# Patient Record
Sex: Female | Born: 2000 | Race: Black or African American | Marital: Single
Health system: Midwestern US, Community
[De-identification: ages and names within clinical notes are randomized; demographics above are authoritative.]

## PROBLEM LIST (undated history)

## (undated) DIAGNOSIS — Q682 Congenital deformity of knee: Secondary | ICD-10-CM

## (undated) DIAGNOSIS — Z9889 Other specified postprocedural states: Secondary | ICD-10-CM

---

## 2021-08-17 ENCOUNTER — Ambulatory Visit: Admit: 2021-08-17 | Discharge: 2021-08-17 | Payer: BLUE CROSS/BLUE SHIELD | Attending: Specialist

## 2021-08-17 DIAGNOSIS — Q682 Congenital deformity of knee: Secondary | ICD-10-CM

## 2021-08-17 NOTE — Progress Notes (Signed)
ASSESSMENT/PLAN:  Below is the assessment and plan developed based on review of pertinent history, physical exam, labs, studies, and medications.    1. Congenital patella maltracking  -     MRI KNEE RIGHT WO CONTRAST; Future      Return for Follow-up after diagnostic test.     In discussion with the patient, we considered the numerus possible diagnoses that could be contributing to their present symptoms. We also deliberated on the extensive management options that must be considered to treat their current condition. We reviewed their accessible prior medical records, diagnostic tests, and current health and employment information. We considered how these symptoms were affecting the patients activities of daily living as well as employment and fitness activities. The patient had various questions regarding the possible risks, benefits, complications, morbidity and mortality regarding their diagnosis and treatment options. The patients comorbidities were considered, and I advocated that they consider maximizing lifestyle modification through nutrition and exercise to aid in addressing their symptoms. Shared decision making yielded an understanding to move forward with conservation treatment preferences. The patient expressed understanding that if conservative management fails to alleviate the present symptoms they will return to office for re-evaluation and consideration of additional diagnostic tests and potential surgical options.     In the interim, we have recommended ice, elevation, and take prescription anti-inflammatory medications along with a physician directed home exercise program. We discussed the risks and common side effects of anti-inflammatory medications and instructed the patient to discontinue the medication and contact us if they experienced any side effects. The patient was encouraged to discuss the possible side effects with their family physician or pharmacist prior to initiating any new  medications.    We discussed the fact that many of the recommended treatment options presented are significantly limited by the patients social determinants of health. We also reviewed the circumstances surrounding the environment that they live and work which affect a wide range of health risk. We considered the limited access to appropriate educational resources regarding proper nutrition and exercise as well as the economic and social support necessary to maintain health and wellbeing.     Given that the patient's symptoms are increasing in frequency and duration, we have decided to evaluate the etiology of the pain and loss of function with an MRI. We discussed the risks of an MRI which include, but are not limited to the enclosed space, noisy environment, magnetic effect on implanted metal. We also talked about the fact that MRI is also contraindicated in the presence of internal metallic objects such as bullets or shrapnel, as well as surgical clips, pins, plates, screws, metal sutures, or wire mesh. We talked about the fact that MRI does not use radiation, but it may be contrindicated if the patient has implanted pacemakers, intracranial aneurysm clips, cochlear implants, certain prosthetic devices, implanted drug infusion pumps, neurostimulators, bone-growth stimulators, certain intrauterine contraceptive devices; or any other type of iron-based metal implants. We discussed the fact that if you are pregnant or suspect that you may be pregnant, you should notify your physician and consult with your primary care or obstetrician before having an MRI.  Although rare, we talked about the fact that if contrast dye is used, there is a risk for allergic reaction to the dye. Patients who are allergic to or sensitive to medications, contrast dye, iodine, or shellfish should notify the radiologist or technologist prior to the administration of dye. MRI contrast may also influence other conditions such as allergies,  asthma,  anemia, hypotension (low blood pressure), and sickle cell disease. The patient has expressed understanding of these risks and I will see the patient back after the MRI to discuss the findings as well as the treatment options.    We talked about the fact that she most likely had patellofemoral maltracking on both knees.  Given the fact that she had failed physical therapy as well as bracing and continues to have discomfort despite conservative management we will proceed with a same-day MRI of her right knee.    SUBJECTIVE/OBJECTIVE:  Joy Hall (DOB: June 13, 2000) is a 21 y.o. female, patient,here for evaluation of the Knee Pain (Bilateral R>L)  .   Patient seen today for bilateral knee pain.  She reports she has had a long history with her knees.  She reports she is had difficulty since she was a child.  She reports her knee gives out constantly.  She reports she tried a brace as well as some physical therapy.  She denies any numbness or tingling.  She reports anti-inflammatories make it better.  She reports she has give way episodes fairly commonly.  She reports she has had to stop exercising.    PHYSICAL EXAM:    Upon physical examination, the patient is well developed, well nourished, alert and oriented times three, with normal mood and affect and walks with an antalgic gait.    Upon examination of the right knee, the patient is nontender to palpation along the medial and lateral joint lines, and has no effusion. They are tender to palpation along the medial and lateral facets of the patella. They have no crepitus of the patellofemoral joint with range of motion, but experience discomfort with patella grind testing. The patient has no discomfort with McMurrays maneuvers, and the knee is stable. They have full range of motion. They have 5/5 strength, and are neurovascularly intact distally. There is no erythema, warmth or skin lesions present.    On examination of the contralateral extremity, the patient  is nontender to palpation and has excellent range of motion, stability and strength.    IMAGING:    I have independently reviewed and interpreted the following images:     4 views of the right knee including a PA flexed standing show no evidence of fracture or dislocation.    No Known Allergies    Current Outpatient Medications   Medication Sig Dispense Refill    ibuprofen (ADVIL;MOTRIN) 600 MG tablet TAKE 1 TABLET 3 TIMES A DAY BY ORAL ROUTE AS NEEDED.       No current facility-administered medications for this visit.       No past medical history on file.    No past surgical history on file.    No family history on file.    Social History     Socioeconomic History    Marital status: Single     Spouse name: Not on file    Number of children: Not on file    Years of education: Not on file    Highest education level: Not on file   Occupational History    Not on file   Tobacco Use    Smoking status: Never    Smokeless tobacco: Never   Vaping Use    Vaping Use: Never used   Substance and Sexual Activity    Alcohol use: Never    Drug use: Never    Sexual activity: Not on file   Other Topics Concern    Not on file  Social History Narrative    Not on file     Social Determinants of Health     Financial Resource Strain: Not on file   Food Insecurity: Not on file   Transportation Needs: Not on file   Physical Activity: Not on file   Stress: Not on file   Social Connections: Not on file   Intimate Partner Violence: Not on file   Housing Stability: Not on file       Review of Systems    No flowsheet data found.    Vitals:  Ht 4\' 11"  (1.499 m)   Wt 169 lb (76.7 kg)   BMI 34.13 kg/m    Body mass index is 34.13 kg/m.      An electronic signature was used to authenticate this note.  -- Illene BolusPaul Caldwell, MD

## 2021-08-26 ENCOUNTER — Ambulatory Visit: Admit: 2021-08-26 | Discharge: 2021-08-26 | Payer: BLUE CROSS/BLUE SHIELD

## 2021-08-26 DIAGNOSIS — Q682 Congenital deformity of knee: Secondary | ICD-10-CM

## 2021-09-09 ENCOUNTER — Ambulatory Visit: Admit: 2021-09-09 | Discharge: 2021-09-09 | Payer: BLUE CROSS/BLUE SHIELD | Attending: Specialist

## 2021-09-09 DIAGNOSIS — Q682 Congenital deformity of knee: Secondary | ICD-10-CM

## 2021-09-09 NOTE — Progress Notes (Signed)
ASSESSMENT/PLAN:  Below is the assessment and plan developed based on review of pertinent history, physical exam, labs, studies, and medications.    1. Congenital patella maltracking      No follow-ups on file.     We discussed the treatment options for the patients diagnosis, which included: living with the extremity as it is, organized exercises, medicines, injections, and surgical options. Utilizing shared treatment decision-making; we also discussed the nature and purpose of the treatment options along with the expected risks and benefits. The patient has expressed a desire to proceed with surgery, and I think that is a reasonable option. I educated the patient regarding the inherent and unavoidable risks which include, but are not limited to anesthesia, infection, damage to nerves and blood vessels, blood loss, blood clots, and even death were discussed at length. We also talked about the possibility of not being able to return to prior activities or employment, the need for future surgery, and complex regional pain syndrome. The patient expressed understanding of these risks and has elected to proceed with surgery. Ample time was given for questions, of which many were addressed. We have discussed the surgical procedure as well as the realistic expectations regarding the risks, outcome and post-operative protocol. We will set this up when it is convenient for the patient. We have instructed them to contact us if there are any questions or concerns between now and their date of surgery.     We talked about the procedure as well as the postoperative protocol in detail.  She is can look at her counter and call us to schedule right knee arthroscopy with patellofemoral chondroplasty and possible MACI biopsy with open medial patellofemoral ligament reconstruction and tibial tubercle osteotomy.  She understands she would be in a brace postoperatively and full rehabilitation may take up to 6 months.  We also talked  about the fact that the same process is more than likely occurring in the contralateral side.    SUBJECTIVE/OBJECTIVE:  Joy Hall (DOB: 05-25-2000) is a 21 y.o. female, patient,here for evaluation of the Knee Pain (right)  .   Patient seen today for bilateral knee pain.  She reports she has had a long history with her knees.  She reports she is had difficulty since she was a child.  She reports her knee gives out constantly.  She reports she tried a brace as well as some physical therapy.  She denies any numbness or tingling.  She reports anti-inflammatories make it better.  She reports she has give way episodes fairly commonly.  She reports she has had to stop exercising.    PHYSICAL EXAM:    Upon physical examination, the patient is well developed, well nourished, alert and oriented times three, with normal mood and affect and walks with an antalgic gait.    Upon examination of the right knee, the patient is nontender to palpation along the medial and lateral joint lines, and has no effusion. They are tender to palpation along the medial and lateral facets of the patella. They have no crepitus of the patellofemoral joint with range of motion, but experience discomfort with patella grind testing. The patient has no discomfort with McMurrays maneuvers, and the knee is stable. They have full range of motion. They have 5/5 strength, and are neurovascularly intact distally. There is no erythema, warmth or skin lesions present.    On examination of the contralateral extremity, the patient is nontender to palpation and has excellent range of motion, stability and  strength.    IMAGING:    I have independently reviewed and interpreted the following images:     MRI KNEE RIGHT WO CONTRAST  Narrative: EXAM: MRI KNEE RIGHT WO CONTRAST    INDICATION: Pain    COMPARISON: Radiographs 07/27/2021    TECHNIQUE: Axial T2 fat-saturated and proton density fat-saturated; coronal T1  and proton density fat-saturated; and sagittal T2  fat-saturated, proton density  fat-saturated, and gradient echo MRI of the right knee .    CONTRAST: None.     FINDINGS: Bone marrow: Subcortical osseous contusions at the anterolateral  lateral femoral condyle and inferomedial patella. The pattern of osseous  contusions is consistent with recent transient patellar dislocation.    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 No disruption of the medial patellar retinaculum is  shown. 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 The TT-TG distance is 17 mm. The TT-PCL  distance is 25 mm.    Articular cartilage: No patellar or femoral osteochondral lesion is  demonstrated. There is a small area of intermediate grade chondral derangement  shown at the middle third lateral patellar facet adjacent to the median ridge,  with tiny focus of underlying reactive bony signal.    Joint fluid: Small knee effusion. Trace Baker's cyst.     Collateral ligaments and posterior, lateral corner: Intact.    Medial meniscus: Intact.      Lateral meniscus: Intact.    ACL and PCL: Intact.    Tendons: Intact.    Muscles: Within normal limits.    Soft tissue mass: None.  Impression: Evidence for recent transient patellar dislocation without demonstration of  osteochondral lesion. 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 A small focus of  intermediate grade chondral derangement is shown in the lateral patellar facet.    We will    No Known Allergies    Current Outpatient Medications   Medication Sig Dispense Refill    ibuprofen (ADVIL;MOTRIN) 600 MG tablet TAKE 1 TABLET 3 TIMES A DAY BY ORAL ROUTE AS NEEDED.       No current facility-administered medications for this visit.       No past medical history on file.    No past surgical history on file.    No family history on file.    Social History     Socioeconomic History    Marital status: Single     Spouse name: Not on file     Number of children: Not on file    Years of education: Not on file    Highest education level: Not on file   Occupational History    Not on file   Tobacco Use    Smoking status: Never    Smokeless tobacco: Never   Vaping Use    Vaping Use: Never used   Substance and Sexual Activity    Alcohol use: Never    Drug use: Never    Sexual activity: Not on file   Other Topics Concern    Not on file   Social History Narrative    Not on file     Social Determinants of Health     Financial Resource Strain: Not on file   Food Insecurity: Not on file   Transportation Needs: Not on file   Physical Activity: Not on file   Stress: Not on file   Social Connections: Not on file   Intimate Partner Violence: Not  on file   Housing Stability: Not on file       Review of Systems    No flowsheet data found.    Vitals:  Ht 4\' 11"  (1.499 m)   Wt 170 lb (77.1 kg)   BMI 34.34 kg/m    Body mass index is 34.34 kg/m.      An electronic signature was used to authenticate this note.  -- , MD

## 2021-10-21 ENCOUNTER — Telehealth

## 2021-10-21 NOTE — Telephone Encounter (Signed)
Patient phones office wanting to schedule sx for her right knee. Patient ID x 2. Dates discussed and confirmed patient has surgery folder.

## 2022-02-10 NOTE — Telephone Encounter (Signed)
Patient called and stated they do not have a time for their surgery on Monday. Please reach back out to patient.

## 2022-02-10 NOTE — Telephone Encounter (Signed)
Patient was called back and discussed surgery arrival time.

## 2022-02-13 ENCOUNTER — Encounter

## 2022-02-14 MED ORDER — OXYCODONE-ACETAMINOPHEN 5-325 MG PO TABS
5-325 MG | ORAL_TABLET | ORAL | 0 refills | Status: AC | PRN
Start: 2022-02-14 — End: 2022-02-20

## 2022-02-14 MED ORDER — CEPHALEXIN 500 MG PO CAPS
500 MG | ORAL_CAPSULE | Freq: Four times a day (QID) | ORAL | 0 refills | Status: AC
Start: 2022-02-14 — End: ?

## 2022-02-24 ENCOUNTER — Encounter

## 2022-02-24 ENCOUNTER — Ambulatory Visit: Admit: 2022-02-24 | Discharge: 2022-02-24 | Payer: BLUE CROSS/BLUE SHIELD | Attending: Specialist

## 2022-02-24 ENCOUNTER — Ambulatory Visit: Admit: 2022-02-24 | Payer: MEDICAID

## 2022-02-24 DIAGNOSIS — Q682 Congenital deformity of knee: Secondary | ICD-10-CM

## 2022-02-24 NOTE — Progress Notes (Signed)
ASSESSMENT/PLAN:  Below is the assessment and plan developed based on review of pertinent history, physical exam, labs, studies, and medications.    1. Congenital patella maltracking  -     Amb External Referral To Physical Therapy  2. S/P right knee arthroscopy  -     Amb External Referral To Physical Therapy      Return in about 3 weeks (around 03/17/2022).     After discussing treatment options, we have decided to proceed with formal physical therapy as well as a home exercise program for rehabilitation of the knee. We went over the arthroscopic pictures and removed the stitches during todays visit. We will continue with ice and elevation of the knee to decrease swelling and pain. We will continue to utilize early mobilization and mechanical prophylaxis to reduce the chances of a deep vein thrombosis. We will wean them off any narcotic medications and progress to anti-inflammatories and Tylenol as long as there are no contraindications to these medications.  We also discussed the risk and benefits and common side effects of taking these medications at todays visit. We also had a discussion regarding not driving while on narcotic medications and while impaired from a surgical or medical condition. I will see them back in three weeks to evaluate their progress. They will call us in the interim if they have any questions or concerns prior to their follow up visit.    SUBJECTIVE/OBJECTIVE:  Joy Hall (DOB: 14-Nov-2000) is a 21 y.o. female, patient,here for evaluation of the Knee Pain (Right )    Patient returns today for follow-up of their knee. They underwent Right knee arthroscopy with arthroscopic lateral release, abrasion arthroplasty of patellar chondral defect, MACI biopsy and medial capsular plication with open hamstring harvest, medial patellofemoral ligament reconstruction with autologous hamstring and Fulkerson osteotomy on 02/16/2022.  The patient denies any numbness tingling erythema or warmth. They  have been icing and elevating as well as taking some anti-inflammatory medications.    PHYSICAL EXAM:    Examination of the operative knee reveals that there is a small effusion. The incisions are well healed, without evidence of drainage, erythema, or warmth. Range of motion and strength are progressing appropriately at this stage of rehabilitation. Strength distally is 5/5. There is no calf tenderness and a negative Homans sign. Sensation is intact to light touch distally and there is a brisk capillary refill.    Imaging:    2 views of the right knee show evidence of tibial tubercle osteotomy in good alignment without change in surgical intervention.    No Known Allergies    Current Outpatient Medications   Medication Sig Dispense Refill    ibuprofen (ADVIL;MOTRIN) 600 MG tablet TAKE 1 TABLET 3 TIMES A DAY BY ORAL ROUTE AS NEEDED.      cephALEXin (KEFLEX) 500 MG capsule Take 1 capsule by mouth 4 times daily (Patient not taking: Reported on 02/24/2022) 30 capsule 0     No current facility-administered medications for this visit.        No past medical history on file.    No past surgical history on file.    No family history on file.    Social History     Socioeconomic History    Marital status: Single     Spouse name: Not on file    Number of children: Not on file    Years of education: Not on file    Highest education level: Not on file   Occupational History  Not on file   Tobacco Use    Smoking status: Never    Smokeless tobacco: Never   Vaping Use    Vaping Use: Never used   Substance and Sexual Activity    Alcohol use: Never    Drug use: Never    Sexual activity: Not on file   Other Topics Concern    Not on file   Social History Narrative    Not on file     Social Determinants of Health     Financial Resource Strain: Not on file   Food Insecurity: Not on file   Transportation Needs: Not on file   Physical Activity: Not on file   Stress: Not on file   Social Connections: Not on file   Intimate Partner  Violence: Not on file   Housing Stability: Not on file       Review of Systems    No flowsheet data found.    Vitals:  Ht 1.499 m (4\' 11" )   Wt 77.1 kg (170 lb)   BMI 34.34 kg/m     Body mass index is 34.34 kg/m.     An electronic signature was used to authenticate this note.  -- Cyndia Skeeters, MD

## 2022-03-16 NOTE — Progress Notes (Signed)
Formatting of this note is different from the original.  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 Congenital deformity of knee       2. S/P right knee arthroscopy             Today's Date: 03/16/2022  Therapist: Edison Pace, PT   Visit count: 3  Progress Note Due: 04/06/2022  Plan of Care Due: 05/06/2022  Time in: 1635                                                  Time out: 1718    Pain in: 1-2/10                                                     Pain out: 1/10               Fall risk: Mod                                                             Falls in the past year: zero  Evaluation Date: 03/06/2022                                      Authorization: Pending  Precautions: Universal, COVID,  Partial weight bearing, knee brace locked in extension wen ambulating   Attendance agreement including no show policy reviewed and signed by patient. A copy of the signed agreement is available in the chart.  Patient identifiers confirmed at evaluation.     right knee arthroscopy with arthroscopic lateral release, abrasion arthroplasty of patellar chondral defect, MACI biopsy and medial capsular plication with open hamstring harvest, medial patellofemoral ligament reconstruction with autologous hamstring and Fulkerson osteotomy     Patient identifiers confirmed prior to treatment session.  Does the patient feel safe in their home? Yes    If no, patient response and action taken    Assessment:   Incorporating SAQs requiring mod-max assistance during exercise with concurrent use of [place] Estim. Using sliding board and strap for assisted heel slides with instruction to stay in pain free ROM. Review of leg raise series with mod cuing to  perform correctly. Patient returns to surgeon on 03/21/2022 for one month follow up.     Plan: Follow up about appt with Dr on 03/21/2021  Pt was informed of next appointment on 03/16/2022 at 1645 and reported understanding.     Subjective: Patient states she is doing well - not as much pain as first visit       Objective:   Neuromuscular Reeducation - to improve quadriceps engagement - 23 min  Towel under knee  50% Duty cycle   Cyctle time 5/5   Burst Freq 50 bps   Ramp 2 sec   3 x 3 minutes   (+) time for set up and education and rest breaks     SAQs  2 x 2 min     Therapeutic Exercise - to improve gross LE strength - 20 min   Prone hip extension   SLR w/ assistance - 2-3" off table   Sidelying adduction   Assisted heel slides 2 x 15           Patient Education:  Patient educated on exercise program - handout provided using verbal explanation, demonstration, and handout.    Patient verbalized understanding, demonstrated understanding, and demonstrated technique   Barriers: Insurance coverage    The patient has good potential for the stated goals.    Communication with other providers: N/A    Time Entry  More data exists       03/16/2022   Time Entry   97110 - Therapeutic Exercise Time Entry 20   97112 - Neuromuscular Re-Education Time Entry 23     Charge Capture       Code Description Service Date Service Provider Modifiers Rob Bunting    [id number] [facility] PT-NEUROMUS RE-ED - EA 15 MIN 03/16/2022  5:18 PM Revonda Humphrey, PT GP 2    [id number] [facility] PT THERAPEUTIC EXERCISES 03/16/2022  5:18 PM Revonda Humphrey, PT GP 1         Electronically signed by Revonda Humphrey, PT at 03/16/2022  5:18 PM EST

## 2022-03-21 ENCOUNTER — Encounter

## 2022-03-21 ENCOUNTER — Ambulatory Visit: Admit: 2022-03-21 | Payer: BLUE CROSS/BLUE SHIELD

## 2022-03-21 ENCOUNTER — Ambulatory Visit: Admit: 2022-03-21 | Discharge: 2022-03-21 | Payer: BLUE CROSS/BLUE SHIELD | Attending: Specialist

## 2022-03-21 DIAGNOSIS — Z9889 Other specified postprocedural states: Secondary | ICD-10-CM

## 2022-03-21 NOTE — Progress Notes (Unsigned)
ASSESSMENT/PLAN:  Below is the assessment and plan developed based on review of pertinent history, physical exam, labs, studies, and medications.    1. S/P right knee arthroscopy      No follow-ups on file.     In discussion with the patient, we considered the numerus possible diagnoses that could be contributing to their present symptoms. We also deliberated on the extensive management options that must be considered to treat their current condition. We reviewed their accessible prior medical records, diagnostic tests, and current health and employment information. We considered how these symptoms were affecting the patients activities of daily living as well as employment and fitness activities. The patient had various questions regarding the possible risks, benefits, complications, morbidity and mortality regarding their diagnosis and treatment options. The patients comorbidities were considered, and I advocated that they consider maximizing lifestyle modification through nutrition and exercise to aid in addressing their symptoms. Shared decision making yielded an understanding to move forward with conservation treatment preferences. The patient expressed understanding that if conservative management fails to alleviate the present symptoms they will return to office for re-evaluation and consideration of additional diagnostic tests and potential surgical options.     In the interim, we have recommended ice, elevation, and take prescription anti-inflammatory medications along with a physician directed home exercise program. We discussed the risks and common side effects of anti-inflammatory medications and instructed the patient to discontinue the medication and contact us if they experienced any side effects. The patient was encouraged to discuss the possible side effects with their family physician or pharmacist prior to initiating any new medications.    We discussed the fact that many of the recommended  treatment options presented are significantly limited by the patients social determinants of health. We also reviewed the circumstances surrounding the environment that they live and work which affect a wide range of health risk. We considered the limited access to appropriate educational resources regarding proper nutrition and exercise as well as the economic and social support necessary to maintain health and wellbeing.     Given that the patient's symptoms are increasing in frequency and duration we have decided to prescribe physical therapy.  We talked about the fact that the goal of physical therapy is for the therapist to assist in developing a program to help return the patient to full strength, function and mobility and decrease pain. We also discussed that the therapist may combine several techniques to help decrease pain.  These include but are not limited to stretching, balance exercises, strength training, massage, cold and heat therapy, and electrical stimulation. Although, physical therapy is generally safe, we went over the potential risks to include the worsening of pre-existing conditions, continued pain and no improvement in flexibility, mobility, and strength. We will have the patient follow up after physical therapy to closely monitor their progress. We talked about following up sooner if therapy is not progressing on a weekly basis.         SUBJECTIVE/OBJECTIVE:  Joy Hall (DOB: 02-Jun-2000) is a 22 y.o. female, patient,here for evaluation of the No chief complaint on file.    Patient returns today for follow-up of their knee. They underwent Right knee arthroscopy with arthroscopic lateral release, abrasion arthroplasty of patellar chondral defect, MACI biopsy and medial capsular plication with open hamstring harvest, medial patellofemoral ligament reconstruction with autologous hamstring and Fulkerson osteotomy on 02/16/2022.  The patient denies any numbness tingling erythema or warmth. They  have been icing and elevating as well as  taking some anti-inflammatory medications.    PHYSICAL EXAM:    Examination of the operative knee reveals that there is a small effusion. The incisions are well healed, without evidence of drainage, erythema, or warmth. Range of motion and strength are progressing appropriately at this stage of rehabilitation. Strength distally is 5/5. There is no calf tenderness and a negative Homans sign. Sensation is intact to light touch distally and there is a brisk capillary refill.    Imaging:    2 views of the right knee show evidence of tibial tubercle osteotomy in good alignment without change in surgical intervention.    No Known Allergies    Current Outpatient Medications   Medication Sig Dispense Refill    cephALEXin (KEFLEX) 500 MG capsule Take 1 capsule by mouth 4 times daily (Patient not taking: Reported on 02/24/2022) 30 capsule 0    ibuprofen (ADVIL;MOTRIN) 600 MG tablet TAKE 1 TABLET 3 TIMES A DAY BY ORAL ROUTE AS NEEDED.       No current facility-administered medications for this visit.        No past medical history on file.    No past surgical history on file.    No family history on file.    Social History     Socioeconomic History    Marital status: Single     Spouse name: Not on file    Number of children: Not on file    Years of education: Not on file    Highest education level: Not on file   Occupational History    Not on file   Tobacco Use    Smoking status: Never    Smokeless tobacco: Never   Vaping Use    Vaping Use: Never used   Substance and Sexual Activity    Alcohol use: Never    Drug use: Never    Sexual activity: Not on file   Other Topics Concern    Not on file   Social History Narrative    Not on file     Social Determinants of Health     Financial Resource Strain: Not on file   Food Insecurity: Not on file   Transportation Needs: Not on file   Physical Activity: Not on file   Stress: Not on file   Social Connections: Not on file   Intimate Partner  Violence: Not on file   Housing Stability: Not on file       Review of Systems    No flowsheet data found.    Vitals:  There were no vitals taken for this visit.    There is no height or weight on file to calculate BMI.     An electronic signature was used to authenticate this note.  -- Illene Bolus, MD

## 2022-05-12 ENCOUNTER — Ambulatory Visit: Admit: 2022-05-12 | Discharge: 2022-05-12 | Payer: MEDICAID | Attending: Specialist

## 2022-05-12 DIAGNOSIS — Z9889 Other specified postprocedural states: Secondary | ICD-10-CM

## 2022-05-12 NOTE — Progress Notes (Unsigned)
ASSESSMENT/PLAN:  Below is the assessment and plan developed based on review of pertinent history, physical exam, labs, studies, and medications.    1. S/P right knee arthroscopy      No follow-ups on file.     In discussion with the patient, we considered the numerus possible diagnoses that could be contributing to their present symptoms. We also deliberated on the extensive management options that must be considered to treat their current condition. We reviewed their accessible prior medical records, diagnostic tests, and current health and employment information. We considered how these symptoms were affecting the patients activities of daily living as well as employment and fitness activities. The patient had various questions regarding the possible risks, benefits, complications, morbidity and mortality regarding their diagnosis and treatment options. The patients comorbidities were considered, and I advocated that they consider maximizing lifestyle modification through nutrition and exercise to aid in addressing their symptoms. Shared decision making yielded an understanding to move forward with conservation treatment preferences. The patient expressed understanding that if conservative management fails to alleviate the present symptoms they will return to office for re-evaluation and consideration of additional diagnostic tests and potential surgical options.     In the interim, we have recommended ice, elevation, and take prescription anti-inflammatory medications along with a physician directed home exercise program. We discussed the risks and common side effects of anti-inflammatory medications and instructed the patient to discontinue the medication and contact us if they experienced any side effects. The patient was encouraged to discuss the possible side effects with their family physician or pharmacist prior to initiating any new medications.    We discussed the fact that many of the recommended  treatment options presented are significantly limited by the patients social determinants of health. We also reviewed the circumstances surrounding the environment that they live and work which affect a wide range of health risk. We considered the limited access to appropriate educational resources regarding proper nutrition and exercise as well as the economic and social support necessary to maintain health and wellbeing.     Given that the patient's symptoms are increasing in frequency and duration we have decided to prescribe physical therapy.  We talked about the fact that the goal of physical therapy is for the therapist to assist in developing a program to help return the patient to full strength, function and mobility and decrease pain. We also discussed that the therapist may combine several techniques to help decrease pain.  These include but are not limited to stretching, balance exercises, strength training, massage, cold and heat therapy, and electrical stimulation. Although, physical therapy is generally safe, we went over the potential risks to include the worsening of pre-existing conditions, continued pain and no improvement in flexibility, mobility, and strength. We will have the patient follow up after physical therapy to closely monitor their progress. We talked about following up sooner if therapy is not progressing on a weekly basis.         SUBJECTIVE/OBJECTIVE:  Joy Hall (DOB: 02-Jun-2000) is a 22 y.o. female, patient,here for evaluation of the No chief complaint on file.    Patient returns today for follow-up of their knee. They underwent Right knee arthroscopy with arthroscopic lateral release, abrasion arthroplasty of patellar chondral defect, MACI biopsy and medial capsular plication with open hamstring harvest, medial patellofemoral ligament reconstruction with autologous hamstring and Fulkerson osteotomy on 02/16/2022.  The patient denies any numbness tingling erythema or warmth. They  have been icing and elevating as well as  taking some anti-inflammatory medications.    PHYSICAL EXAM:    Examination of the operative knee reveals that there is a small effusion. The incisions are well healed, without evidence of drainage, erythema, or warmth. Range of motion and strength are progressing appropriately at this stage of rehabilitation. Strength distally is 5/5. There is no calf tenderness and a negative Homans sign. Sensation is intact to light touch distally and there is a brisk capillary refill.    Imaging:    2 views of the right knee show evidence of tibial tubercle osteotomy in good alignment without change in surgical intervention.    No Known Allergies    Current Outpatient Medications   Medication Sig Dispense Refill    cephALEXin (KEFLEX) 500 MG capsule Take 1 capsule by mouth 4 times daily (Patient not taking: Reported on 02/24/2022) 30 capsule 0    ibuprofen (ADVIL;MOTRIN) 600 MG tablet TAKE 1 TABLET 3 TIMES A DAY BY ORAL ROUTE AS NEEDED.       No current facility-administered medications for this visit.        No past medical history on file.    No past surgical history on file.    No family history on file.    Social History     Socioeconomic History    Marital status: Single     Spouse name: Not on file    Number of children: Not on file    Years of education: Not on file    Highest education level: Not on file   Occupational History    Not on file   Tobacco Use    Smoking status: Never    Smokeless tobacco: Never   Vaping Use    Vaping Use: Never used   Substance and Sexual Activity    Alcohol use: Never    Drug use: Never    Sexual activity: Not on file   Other Topics Concern    Not on file   Social History Narrative    Not on file     Social Determinants of Health     Financial Resource Strain: Not on file   Food Insecurity: Not on file   Transportation Needs: Not on file   Physical Activity: Not on file   Stress: Not on file   Social Connections: Not on file   Intimate Partner  Violence: Not on file   Housing Stability: Not on file       Review of Systems    No flowsheet data found.    Vitals:  There were no vitals taken for this visit.    There is no height or weight on file to calculate BMI.     An electronic signature was used to authenticate this note.  -- Illene Bolus, MD

## 2022-06-26 ENCOUNTER — Ambulatory Visit: Admit: 2022-06-26 | Discharge: 2022-06-26 | Payer: MEDICAID | Attending: Specialist

## 2022-06-26 DIAGNOSIS — Z9889 Other specified postprocedural states: Secondary | ICD-10-CM

## 2022-06-26 NOTE — Progress Notes (Signed)
ASSESSMENT/PLAN:  Below is the assessment and plan developed based on review of pertinent history, physical exam, labs, studies, and medications.    1. S/P right knee arthroscopy  -     Amb External Referral To Physical Therapy      No follow-ups on file.     In discussion with the patient, we considered the numerus possible diagnoses that could be contributing to their present symptoms. We also deliberated on the extensive management options that must be considered to treat their current condition. We reviewed their accessible prior medical records, diagnostic tests, and current health and employment information. We considered how these symptoms were affecting the patients activities of daily living as well as employment and fitness activities. The patient had various questions regarding the possible risks, benefits, complications, morbidity and mortality regarding their diagnosis and treatment options. The patients comorbidities were considered, and I advocated that they consider maximizing lifestyle modification through nutrition and exercise to aid in addressing their symptoms. Shared decision making yielded an understanding to move forward with conservation treatment preferences. The patient expressed understanding that if conservative management fails to alleviate the present symptoms they will return to office for re-evaluation and consideration of additional diagnostic tests and potential surgical options.     In the interim, we have recommended ice, elevation, and take prescription anti-inflammatory medications along with a physician directed home exercise program. We discussed the risks and common side effects of anti-inflammatory medications and instructed the patient to discontinue the medication and contact us if they experienced any side effects. The patient was encouraged to discuss the possible side effects with their family physician or pharmacist prior to initiating any new medications.    We  discussed the fact that many of the recommended treatment options presented are significantly limited by the patients social determinants of health. We also reviewed the circumstances surrounding the environment that they live and work which affect a wide range of health risk. We considered the limited access to appropriate educational resources regarding proper nutrition and exercise as well as the economic and social support necessary to maintain health and wellbeing.     Given that the patient's symptoms are increasing in frequency and duration we have decided to prescribe physical therapy.  We talked about the fact that the goal of physical therapy is for the therapist to assist in developing a program to help return the patient to full strength, function and mobility and decrease pain. We also discussed that the therapist may combine several techniques to help decrease pain.  These include but are not limited to stretching, balance exercises, strength training, massage, cold and heat therapy, and electrical stimulation. Although, physical therapy is generally safe, we went over the potential risks to include the worsening of pre-existing conditions, continued pain and no improvement in flexibility, mobility, and strength. We will have the patient follow up after physical therapy to closely monitor their progress. We talked about following up sooner if therapy is not progressing on a weekly basis.     She has been making progress.  Will continue her therapy as well as home exercise program to work on her range of motion especially knee flexion, as well as strengthening.  Will see her back in 4 to 6 weeks for repeat evaluation    SUBJECTIVE/OBJECTIVE:  Joy Hall (DOB: 04-05-2000) is a 22 y.o. female, patient,here for evaluation of the Post-Op Check and Knee Pain (right)    Patient returns today for follow-up of their knee. They underwent  Right knee arthroscopy with arthroscopic lateral release, abrasion  arthroplasty of patellar chondral defect, MACI biopsy and medial capsular plication with open hamstring harvest, medial patellofemoral ligament reconstruction with autologous hamstring and Fulkerson osteotomy on 02/16/2022.  The patient denies any numbness tingling erythema or warmth. They have been icing and elevating as well as taking some anti-inflammatory medications.  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    PHYSICAL EXAM:    Examination of the operative knee reveals that there is a small effusion. The incisions are well healed, without evidence of drainage, erythema, or warmth. Range of motion and strength are progressing appropriately at this stage of rehabilitation. Strength distally is 5/5. There is no calf tenderness and a negative Homans sign. Sensation is intact to light touch distally and there is a brisk capillary refill.    Imaging:    2 views of the right knee show evidence of tibial tubercle osteotomy in good alignment without change in surgical intervention.    No Known Allergies    Current Outpatient Medications   Medication Sig Dispense Refill    ibuprofen (ADVIL;MOTRIN) 600 MG tablet TAKE 1 TABLET 3 TIMES A DAY BY ORAL ROUTE AS NEEDED.      cephALEXin (KEFLEX) 500 MG capsule Take 1 capsule by mouth 4 times daily (Patient not taking: Reported on 02/24/2022) 30 capsule 0     No current facility-administered medications for this visit.        No past medical history on file.    No past surgical history on file.    No family history on file.    Social History     Socioeconomic History    Marital status: Single     Spouse name: Not on file    Number of children: Not on file    Years of education: Not on file    Highest education level: Not on file   Occupational History    Not on file   Tobacco Use    Smoking status: Never    Smokeless tobacco: Never   Vaping Use    Vaping Use: Never used   Substance and Sexual Activity    Alcohol use: Never    Drug use: Never     Sexual activity: Not on file   Other Topics Concern    Not on file   Social History Narrative    Not on file     Social Determinants of Health     Financial Resource Strain: Not on file   Food Insecurity: Not on file   Transportation Needs: Not on file   Physical Activity: Not on file   Stress: Not on file   Social Connections: Not on file   Intimate Partner Violence: Not on file   Housing Stability: Not on file       Review of Systems    Failed to redirect to the Timeline version of the [employer] SmartLink.    Vitals:  Ht 1.499 m (4\' 11" )   Wt 77.1 kg (170 lb)   BMI 34.34 kg/m     Body mass index is 34.34 kg/m.     An electronic signature was used to authenticate this note.  -- Illene Bolus, MD

## 2022-08-07 ENCOUNTER — Ambulatory Visit: Admit: 2022-08-07 | Discharge: 2022-08-07 | Payer: MEDICAID | Attending: Specialist

## 2022-08-07 ENCOUNTER — Encounter: Payer: MEDICAID | Attending: Specialist

## 2022-08-07 DIAGNOSIS — Z9889 Other specified postprocedural states: Secondary | ICD-10-CM

## 2022-08-07 NOTE — Progress Notes (Signed)
ASSESSMENT/PLAN:  Below is the assessment and plan developed based on review of pertinent history, physical exam, labs, studies, and medications.    1. S/P right knee arthroscopy  2. Congenital patella maltracking  3. Simple obesity        No follow-ups on file.     In discussion with the patient, we considered the numerus possible diagnoses that could be contributing to their present symptoms. We also deliberated on the extensive management options that must be considered to treat their current condition. We reviewed their accessible prior medical records, diagnostic tests, and current health and employment information. We considered how these symptoms were affecting the patients activities of daily living as well as employment and fitness activities. The patient had various questions regarding the possible risks, benefits, complications, morbidity and mortality regarding their diagnosis and treatment options. The patients comorbidities were considered, and I advocated that they consider maximizing lifestyle modification through nutrition and exercise to aid in addressing their symptoms. Shared decision making yielded an understanding to move forward with conservation treatment preferences. The patient expressed understanding that if conservative management fails to alleviate the present symptoms they will return to office for re-evaluation and consideration of additional diagnostic tests and potential surgical options.     In the interim, we have recommended ice, elevation, and take prescription anti-inflammatory medications along with a physician directed home exercise program. We discussed the risks and common side effects of anti-inflammatory medications and instructed the patient to discontinue the medication and contact us if they experienced any side effects. The patient was encouraged to discuss the possible side effects with their family physician or pharmacist prior to initiating any new  medications.    We discussed the fact that many of the recommended treatment options presented are significantly limited by the patients social determinants of health. We also reviewed the circumstances surrounding the environment that they live and work which affect a wide range of health risk. We considered the limited access to appropriate educational resources regarding proper nutrition and exercise as well as the economic and social support necessary to maintain health and wellbeing.     Given that the patient's symptoms are increasing in frequency and duration we have decided to prescribe physical therapy.  We talked about the fact that the goal of physical therapy is for the therapist to assist in developing a program to help return the patient to full strength, function and mobility and decrease pain. We also discussed that the therapist may combine several techniques to help decrease pain.  These include but are not limited to stretching, balance exercises, strength training, massage, cold and heat therapy, and electrical stimulation. Although, physical therapy is generally safe, we went over the potential risks to include the worsening of pre-existing conditions, continued pain and no improvement in flexibility, mobility, and strength. We will have the patient follow up after physical therapy to closely monitor their progress. We talked about following up sooner if therapy is not progressing on a weekly basis.     I have encouraged the patient to take an active role in their treatment by pursuing a healthy lifestyle. We had a long discussion regarding the risk of excess weight not only to the musculoskeletal system, but also to every aspect of health. I expressed my concerns regarding the increased risk of several debilitating, and deadly diseases, including diabetes, heart disease, and some cancers. We talked about the fundamental link between obesity and chronic inflammation and pain in the body. I  advised  the patient to contact their primary care provider to discuss weight loss opportunities.    Will continue formal physical therapy and progress her to a home exercise program.  Will release her to start working in the gym.  I will see her back at the end of summer to evaluate her progress.    SUBJECTIVE/OBJECTIVE:  Joy Hall (DOB: 15-Jun-2000) is a 22 y.o. female, patient,here for evaluation of the Knee Pain (right)    Patient returns today for follow-up of their knee. They underwent Right knee arthroscopy with arthroscopic lateral release, abrasion arthroplasty of patellar chondral defect, MACI biopsy and medial capsular plication with open hamstring harvest, medial patellofemoral ligament reconstruction with autologous hamstring and Fulkerson osteotomy on 02/16/2022.  The patient denies any numbness tingling erythema or warmth. They have been icing and elevating as well as taking some anti-inflammatory medications.  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    PHYSICAL EXAM:    Examination of the operative knee reveals that there is a small effusion. The incisions are well healed, without evidence of drainage, erythema, or warmth. Range of motion and strength are progressing appropriately at this stage of rehabilitation. Strength distally is 5/5. There is no calf tenderness and a negative Homans sign. Sensation is intact to light touch distally and there is a brisk capillary refill.    Imaging:    2 views of the right knee show evidence of tibial tubercle osteotomy in good alignment without change in surgical intervention.    No Known Allergies    Current Outpatient Medications   Medication Sig Dispense Refill    ibuprofen (ADVIL;MOTRIN) 600 MG tablet TAKE 1 TABLET 3 TIMES A DAY BY ORAL ROUTE AS NEEDED.      cephALEXin (KEFLEX) 500 MG capsule Take 1 capsule by mouth 4 times daily (Patient not taking: Reported on 02/24/2022) 30 capsule 0     No current facility-administered  medications for this visit.        No past medical history on file.    No past surgical history on file.    No family history on file.    Social History     Socioeconomic History    Marital status: Single     Spouse name: Not on file    Number of children: Not on file    Years of education: Not on file    Highest education level: Not on file   Occupational History    Not on file   Tobacco Use    Smoking status: Never    Smokeless tobacco: Never   Vaping Use    Vaping Use: Never used   Substance and Sexual Activity    Alcohol use: Never    Drug use: Never    Sexual activity: Not on file   Other Topics Concern    Not on file   Social History Narrative    Not on file     Social Determinants of Health     Financial Resource Strain: Not on file   Food Insecurity: Not on file   Transportation Needs: Not on file   Physical Activity: Not on file   Stress: Not on file   Social Connections: Not on file   Intimate Partner Violence: Not on file   Housing Stability: Not on file       Review of Systems    Failed to redirect to the Timeline version of the [employer] SmartLink.  Vitals:  Ht 1.499 m (4\' 11" )   Wt 77.1 kg (170 lb)   BMI 34.34 kg/m     Body mass index is 34.34 kg/m.     An electronic signature was used to authenticate this note.  -- Illene Bolus, MD

## 2022-08-07 NOTE — Progress Notes (Unsigned)
ASSESSMENT/PLAN:  Below is the assessment and plan developed based on review of pertinent history, physical exam, labs, studies, and medications.    1. S/P right knee arthroscopy  2. Congenital patella maltracking      No follow-ups on file.     In discussion with the patient, we considered the numerus possible diagnoses that could be contributing to their present symptoms. We also deliberated on the extensive management options that must be considered to treat their current condition. We reviewed their accessible prior medical records, diagnostic tests, and current health and employment information. We considered how these symptoms were affecting the patients activities of daily living as well as employment and fitness activities. The patient had various questions regarding the possible risks, benefits, complications, morbidity and mortality regarding their diagnosis and treatment options. The patients comorbidities were considered, and I advocated that they consider maximizing lifestyle modification through nutrition and exercise to aid in addressing their symptoms. Shared decision making yielded an understanding to move forward with conservation treatment preferences. The patient expressed understanding that if conservative management fails to alleviate the present symptoms they will return to office for re-evaluation and consideration of additional diagnostic tests and potential surgical options.     In the interim, we have recommended ice, elevation, and take prescription anti-inflammatory medications along with a physician directed home exercise program. We discussed the risks and common side effects of anti-inflammatory medications and instructed the patient to discontinue the medication and contact us if they experienced any side effects. The patient was encouraged to discuss the possible side effects with their family physician or pharmacist prior to initiating any new medications.    We discussed the  fact that many of the recommended treatment options presented are significantly limited by the patients social determinants of health. We also reviewed the circumstances surrounding the environment that they live and work which affect a wide range of health risk. We considered the limited access to appropriate educational resources regarding proper nutrition and exercise as well as the economic and social support necessary to maintain health and wellbeing.     Given that the patient's symptoms are increasing in frequency and duration we have decided to prescribe physical therapy.  We talked about the fact that the goal of physical therapy is for the therapist to assist in developing a program to help return the patient to full strength, function and mobility and decrease pain. We also discussed that the therapist may combine several techniques to help decrease pain.  These include but are not limited to stretching, balance exercises, strength training, massage, cold and heat therapy, and electrical stimulation. Although, physical therapy is generally safe, we went over the potential risks to include the worsening of pre-existing conditions, continued pain and no improvement in flexibility, mobility, and strength. We will have the patient follow up after physical therapy to closely monitor their progress. We talked about following up sooner if therapy is not progressing on a weekly basis.     She has been making progress.  Will continue her therapy as well as home exercise program to work on her range of motion especially knee flexion, as well as strengthening.  Will see her back in 4 to 6 weeks for repeat evaluation    SUBJECTIVE/OBJECTIVE:  Joy Hall (DOB: 07-01-2000) is a 22 y.o. female, patient,here for evaluation of the No chief complaint on file.    Patient returns today for follow-up of their knee. They underwent Right knee arthroscopy with arthroscopic lateral release, abrasion  arthroplasty of patellar  chondral defect, MACI biopsy and medial capsular plication with open hamstring harvest, medial patellofemoral ligament reconstruction with autologous hamstring and Fulkerson osteotomy on 02/16/2022.  The patient denies any numbness tingling erythema or warmth. They have been icing and elevating as well as taking some anti-inflammatory medications.  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    PHYSICAL EXAM:    Examination of the operative knee reveals that there is a small effusion. The incisions are well healed, without evidence of drainage, erythema, or warmth. Range of motion and strength are progressing appropriately at this stage of rehabilitation. Strength distally is 5/5. There is no calf tenderness and a negative Homans sign. Sensation is intact to light touch distally and there is a brisk capillary refill.    Imaging:    2 views of the right knee show evidence of tibial tubercle osteotomy in good alignment without change in surgical intervention.    No Known Allergies    Current Outpatient Medications   Medication Sig Dispense Refill   . cephALEXin (KEFLEX) 500 MG capsule Take 1 capsule by mouth 4 times daily (Patient not taking: Reported on 02/24/2022) 30 capsule 0   . ibuprofen (ADVIL;MOTRIN) 600 MG tablet TAKE 1 TABLET 3 TIMES A DAY BY ORAL ROUTE AS NEEDED.       No current facility-administered medications for this visit.        No past medical history on file.    No past surgical history on file.    No family history on file.    Social History     Socioeconomic History   . Marital status: Single     Spouse name: Not on file   . Number of children: Not on file   . Years of education: Not on file   . Highest education level: Not on file   Occupational History   . Not on file   Tobacco Use   . Smoking status: Never   . Smokeless tobacco: Never   Vaping Use   . Vaping Use: Never used   Substance and Sexual Activity   . Alcohol use: Never   . Drug use: Never   . Sexual activity:  Not on file   Other Topics Concern   . Not on file   Social History Narrative   . Not on file     Social Determinants of Health     Financial Resource Strain: Not on file   Food Insecurity: Not on file   Transportation Needs: Not on file   Physical Activity: Not on file   Stress: Not on file   Social Connections: Not on file   Intimate Partner Violence: Not on file   Housing Stability: Not on file       Review of Systems    Failed to redirect to the Timeline version of the REVFS SmartLink.    Vitals:  There were no vitals taken for this visit.    There is no height or weight on file to calculate BMI.     An electronic signature was used to authenticate this note.  -- Illene Bolus, MD

## 2022-10-23 ENCOUNTER — Ambulatory Visit: Admit: 2022-10-23 | Discharge: 2022-10-23 | Payer: MEDICAID | Attending: Specialist

## 2022-10-23 NOTE — Progress Notes (Signed)
ASSESSMENT/PLAN:  Below is the assessment and plan developed based on review of pertinent history, physical exam, labs, studies, and medications.    1. S/P right knee arthroscopy  2. Congenital patella maltracking  3. Quadriceps weakness  4. Simple obesity        Return if symptoms worsen or fail to improve.     In discussion with the patient, we considered the numerus possible diagnoses that could be contributing to their present symptoms. We also deliberated on the extensive management options that must be considered to treat their current condition. We reviewed their accessible prior medical records, diagnostic tests, and current health and employment information. We considered how these symptoms were affecting the patients activities of daily living as well as employment and fitness activities. The patient had various questions regarding the possible risks, benefits, complications, morbidity and mortality regarding their diagnosis and treatment options. The patients comorbidities were considered, and I advocated that they consider maximizing lifestyle modification through nutrition and exercise to aid in addressing their symptoms. Shared decision making yielded an understanding to move forward with conservation treatment preferences. The patient expressed understanding that if conservative management fails to alleviate the present symptoms they will return to office for re-evaluation and consideration of additional diagnostic tests and potential surgical options.     In the interim, we have recommended ice, elevation, and take prescription anti-inflammatory medications along with a physician directed home exercise program. We discussed the risks and common side effects of anti-inflammatory medications and instructed the patient to discontinue the medication and contact us if they experienced any side effects. The patient was encouraged to discuss the possible side effects with their family physician or  pharmacist prior to initiating any new medications.    We discussed the fact that many of the recommended treatment options presented are significantly limited by the patients social determinants of health. We also reviewed the circumstances surrounding the environment that they live and work which affect a wide range of health risk. We considered the limited access to appropriate educational resources regarding proper nutrition and exercise as well as the economic and social support necessary to maintain health and wellbeing.     Given that the patient's symptoms are increasing in frequency and duration we have decided to prescribe physical therapy.  We talked about the fact that the goal of physical therapy is for the therapist to assist in developing a program to help return the patient to full strength, function and mobility and decrease pain. We also discussed that the therapist may combine several techniques to help decrease pain.  These include but are not limited to stretching, balance exercises, strength training, massage, cold and heat therapy, and electrical stimulation. Although, physical therapy is generally safe, we went over the potential risks to include the worsening of pre-existing conditions, continued pain and no improvement in flexibility, mobility, and strength. We will have the patient follow up after physical therapy to closely monitor their progress. We talked about following up sooner if therapy is not progressing on a weekly basis.     I have encouraged the patient to take an active role in their treatment by pursuing a healthy lifestyle. We had a long discussion regarding the risk of excess weight not only to the musculoskeletal system, but also to every aspect of health. I expressed my concerns regarding the increased risk of several debilitating, and deadly diseases, including diabetes, heart disease, and some cancers. We talked about the fundamental link between obesity and chronic  inflammation and pain in the body. I advised the patient to contact their primary care provider to discuss weight loss opportunities.    Continues to progress well with therapy and home exercise program.  Currently denies any pain or discomfort in the right knee.  She feels that she can do all of her ADLs and physical activities without any issue or hindrance.  Overall, she is very happy with her results.  Will see her on an as-needed basis.    SUBJECTIVE/OBJECTIVE:  Joy Hall (DOB: October 11, 2000) is a 22 y.o. female, patient,here for evaluation of the right knee.    Patient returns today for follow-up of their knee. They underwent Right knee arthroscopy with arthroscopic lateral release, abrasion arthroplasty of patellar chondral defect, MACI biopsy and medial capsular plication with open hamstring harvest, medial patellofemoral ligament reconstruction with autologous hamstring and Fulkerson osteotomy on 02/16/2022.  The patient denies any numbness tingling erythema or warmth.  She is doing very well and has no pain, discomfort in the knee.  She feels that she has progressed very well with therapy.  She continues to do therapy twice a week as well as a home exercise program.    PHYSICAL EXAM:    Examination of the operative knee reveals that there is no obvious effusion. The incisions are well healed, without evidence of drainage, erythema, or warmth.  She currently has painless full passive and active range of motion of the right knee. Strength is 5/5. There is no calf tenderness and a negative Homans sign. Sensation is intact to light touch distally and there is a brisk capillary refill.    Imaging:    2 views of the right knee show evidence of tibial tubercle osteotomy in good alignment without change in surgical intervention.    No Known Allergies    Current Outpatient Medications   Medication Sig Dispense Refill    ibuprofen (ADVIL;MOTRIN) 600 MG tablet TAKE 1 TABLET 3 TIMES A DAY BY ORAL ROUTE AS NEEDED.       cephALEXin (KEFLEX) 500 MG capsule Take 1 capsule by mouth 4 times daily (Patient not taking: Reported on 02/24/2022) 30 capsule 0     No current facility-administered medications for this visit.        No past medical history on file.    No past surgical history on file.    No family history on file.    Social History     Socioeconomic History    Marital status: Single     Spouse name: Not on file    Number of children: Not on file    Years of education: Not on file    Highest education level: Not on file   Occupational History    Not on file   Tobacco Use    Smoking status: Never    Smokeless tobacco: Never   Vaping Use    Vaping Use: Never used   Substance and Sexual Activity    Alcohol use: Never    Drug use: Never    Sexual activity: Not on file   Other Topics Concern    Not on file   Social History Narrative    Not on file     Social Determinants of Health     Financial Resource Strain: Not on file   Food Insecurity: Not on file   Transportation Needs: Not on file   Physical Activity: Not on file   Stress: Not on file   Social Connections: Not on file  Intimate Partner Violence: Not on file   Housing Stability: Not on file       Review of Systems    Failed to redirect to the Timeline version of the REVFS SmartLink.    Vitals:  Ht 1.499 m (4\' 11" )   Wt 77.1 kg (170 lb)   BMI 34.34 kg/m     Body mass index is 34.34 kg/m.     An electronic signature was used to authenticate this note.  -- Illene Bolus, MD

## 2022-12-28 IMAGING — CT CT KNEE RT W/O CONTRAST
2 of 3 series · 7 of 33 positions shown, 8 images · non-contrast
Comparison: Outside MRI report dated 08/27/2021.

﻿EXAM:  CT KNEE RT W/O CONTRAST
INDICATION: Chronic instability of the right knee.  Right knee surgery in January 2022.
TECHNIQUE: CT was performed through the right knee. Multi planar and 3D reconstruction images obtained. Radiation dose 419 mGy cm.  Images were reviewed in multiple windows and projections. Exam was performed using 1 or more of the following dose reduction techniques: Automated exposure control, adjustment of the mA and/or kV according to patient size, or the use of iterative reconstruction technique.

[axial · axial · 0.26mm/px · z∈[+540,+676]mm · 4 of 100 slices shown, 5 images]
[im 16/100  soft-tissue]
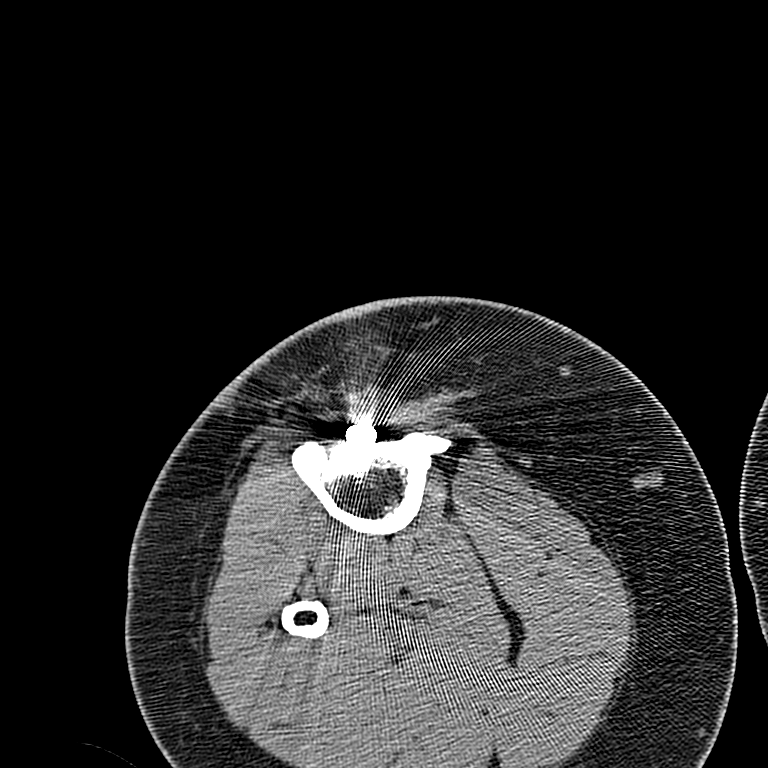
[im 16/100  bone]
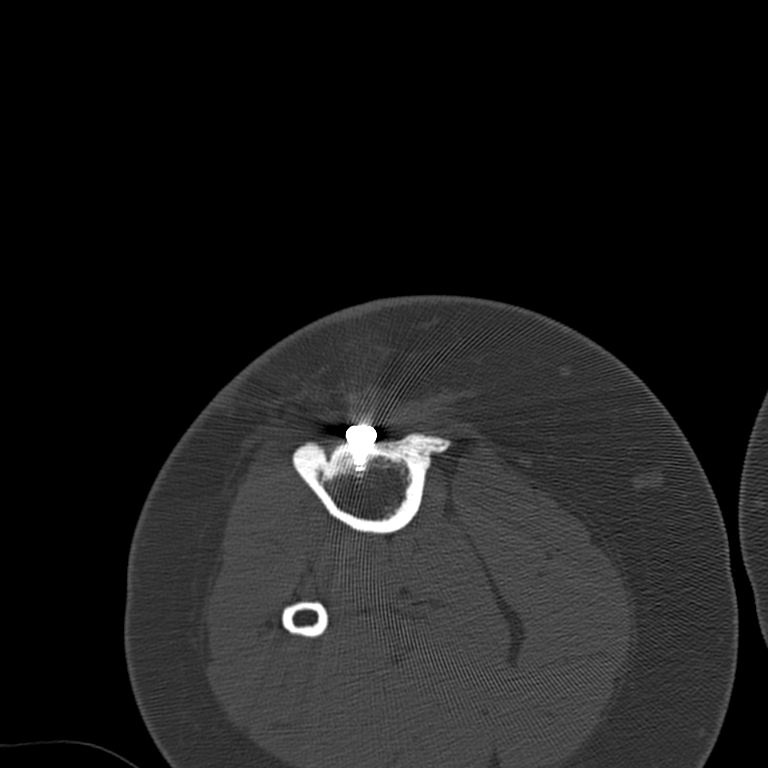
[im 39/100  bone]
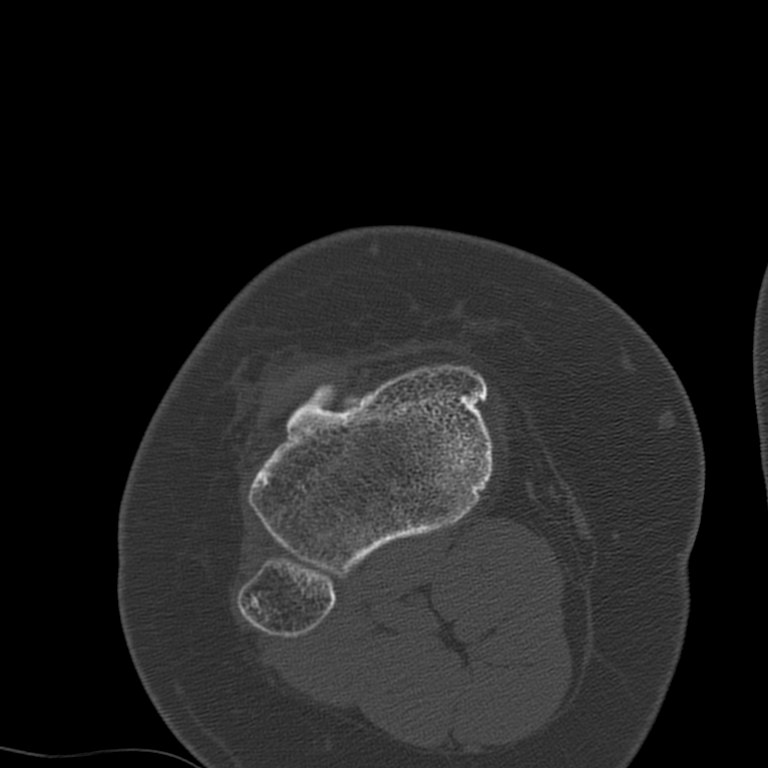
[im 61/100  bone]
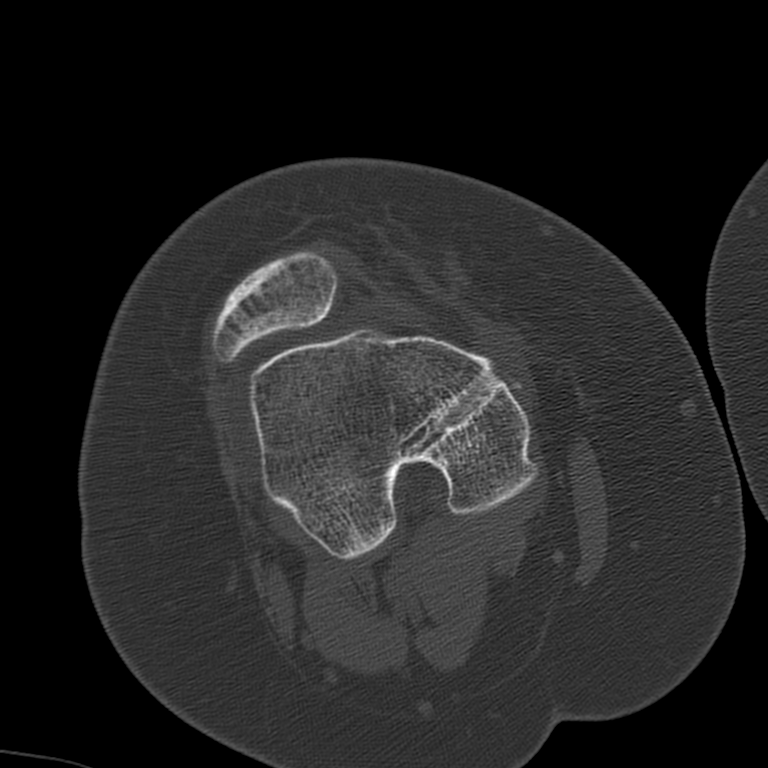
[im 84/100  bone]
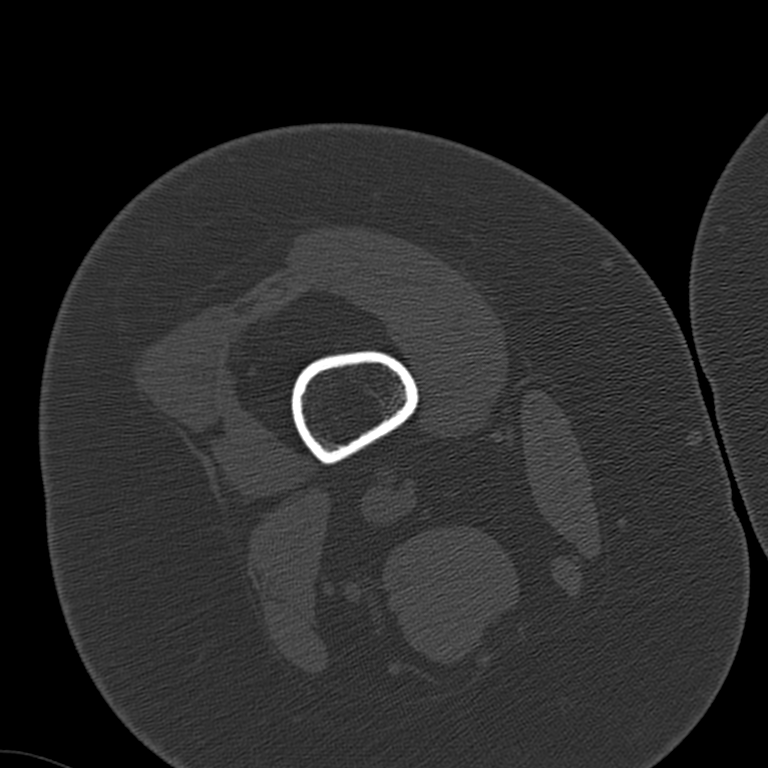

[cor · coronal · 0.26mm/px · 3 of 68 slices shown]
[im 14/68  bone]
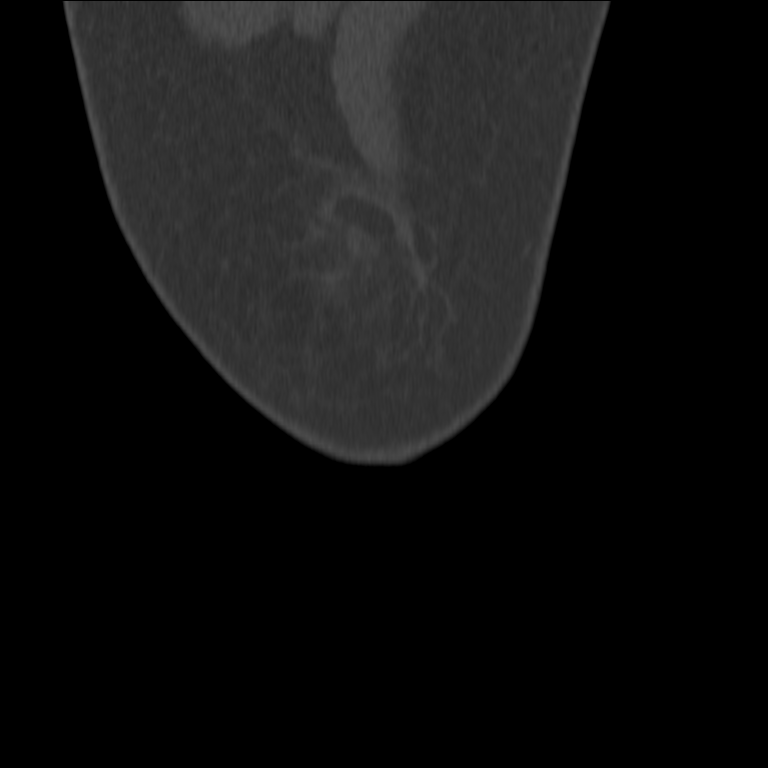
[im 27/68  bone]
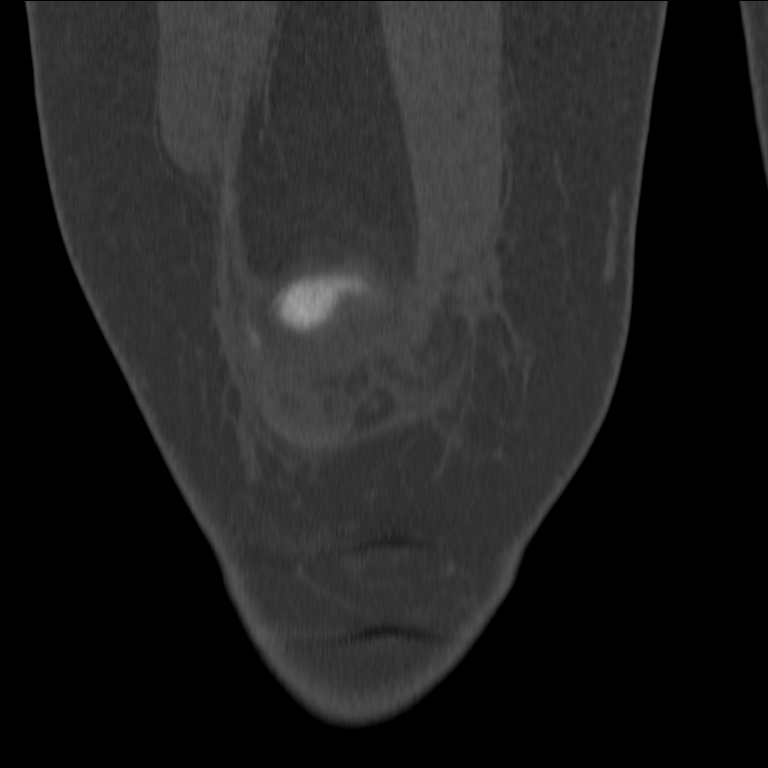
[im 41/68  bone]
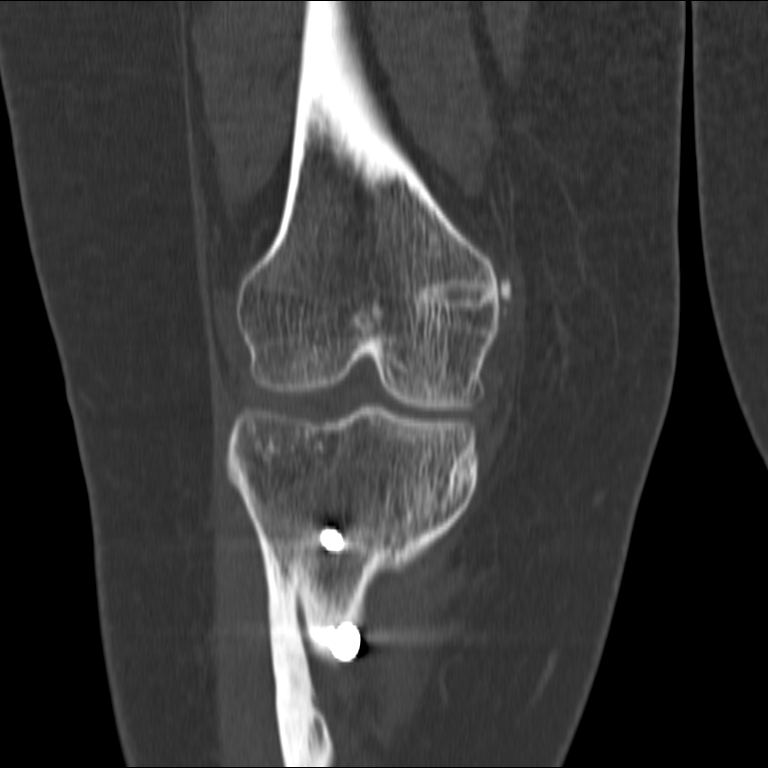

[7 of 33 positions shown; findings below may reference images not displayed]

FINDINGS: No acute bony lesions are seen.  Post-surgical changes of proximal tibia is noted. Prominent anterior osteophyte formation from the anterior superior articular surface of the proximal tibia is noted.

Mild loss of joint space of the lateral facet of patellofemoral joint.  No significant dislocation or subluxation is seen.  Small effusion is noted in the knee joint.
IMPRESSION: 1. Post-surgical changes of proximal tibia.

2. Anterior osteophyte formation from the anterior superior surface of proximal tibia at the knee.

3. Mild loss of joint space of the lateral facet of patellofemoral joint.  No significant dislocation or subluxation.  Mild patella Alta.

Electronically Signed by QUEJADA, CONCEJO at [id number] [DATE]

## 2023-01-09 IMAGING — MR MRI KNEE RT W/O CONTRAST
5 series · 35 of 40 positions shown · IV contrast (gadolinium)
Comparison: CT of the right knee dated 12/27/2021.

﻿EXAM:  [id number]   MRI KNEE RT W/O CONTRAST
INDICATION: 22-year-old with persistent right knee pain.  Previous history of knee surgery in 5855.  Clinical diagnosis common peroneal nerve neuropathy.
TECHNIQUE: Multiplanar, multisequential MRI of the right knee was performed without gadolinium contrast.

[Series 5: PD fat-sat · axial · right · 5.5mm · 0.39mm/px · z∈[-16,+134]mm · 8 of 26 slices shown (1 of 3)]
[im 1/26]
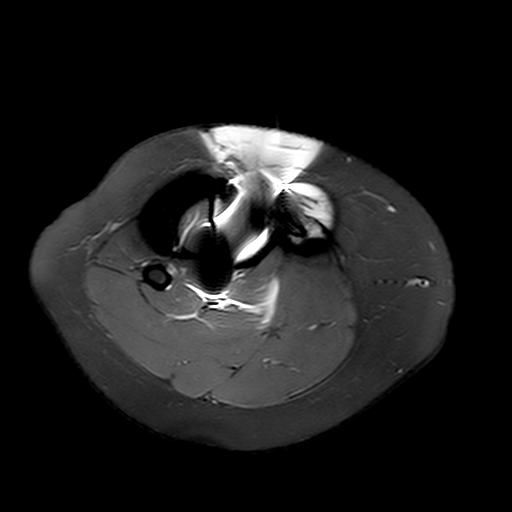
[im 4/26]
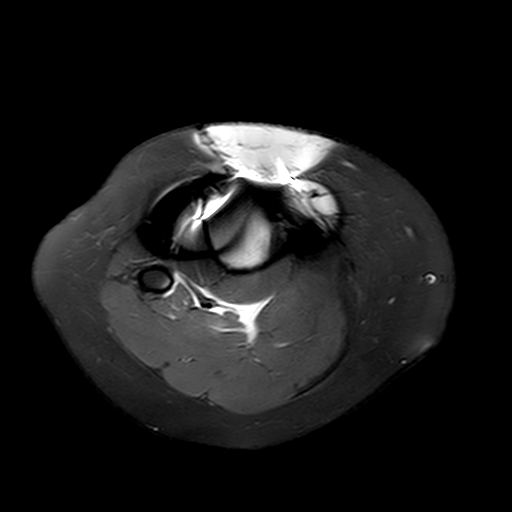
[im 8/26]
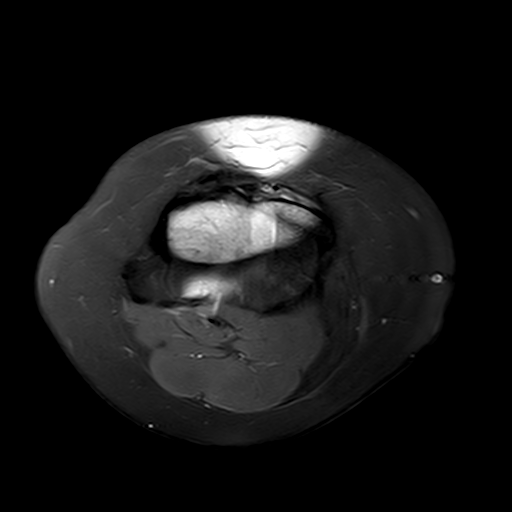
[im 11/26]
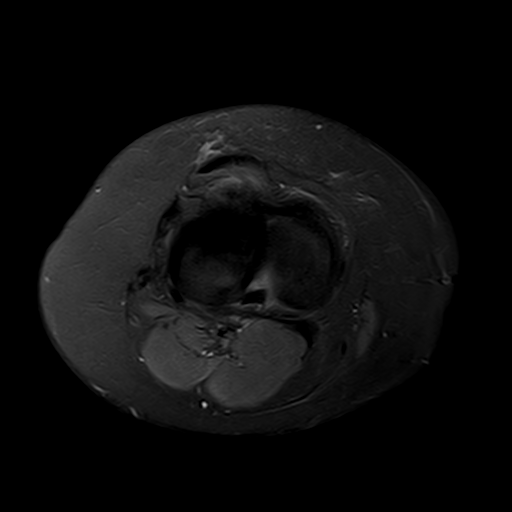
[im 15/26]
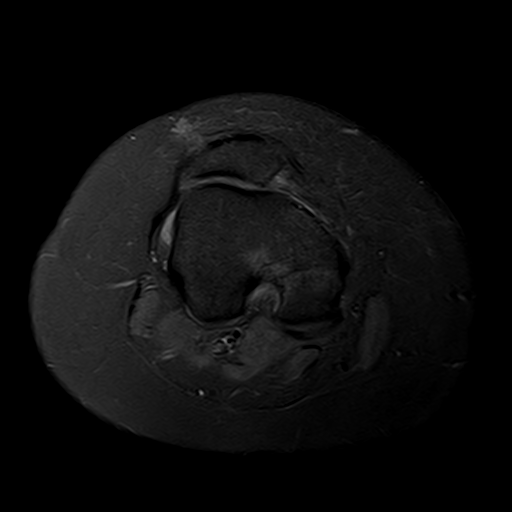
[im 18/26]
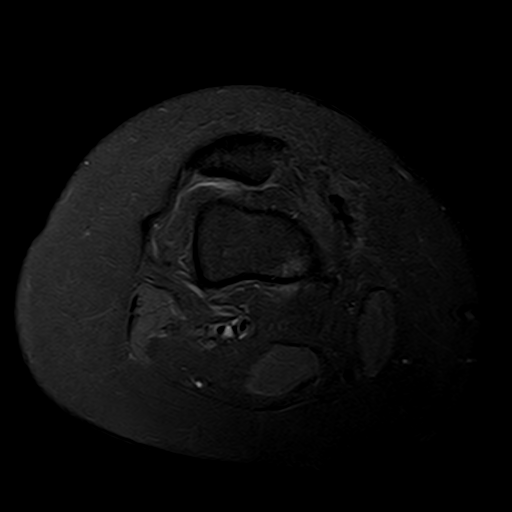
[im 22/26]
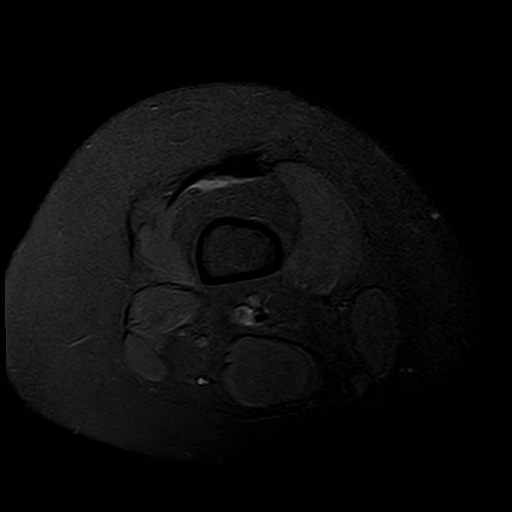
[im 26/26]
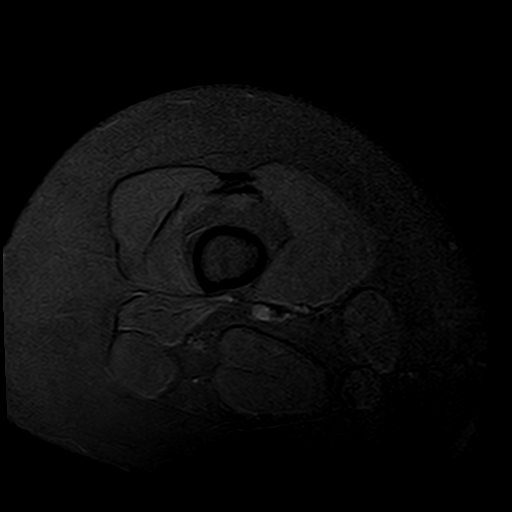

[Series 6: PD fat-sat · sagittal · right · 5.5mm · 0.62mm/px · 8 of 30 slices shown (2 of 3)]
[im 1/30]
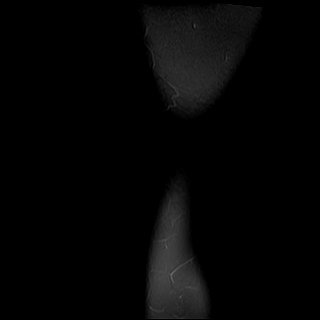
[im 5/30]
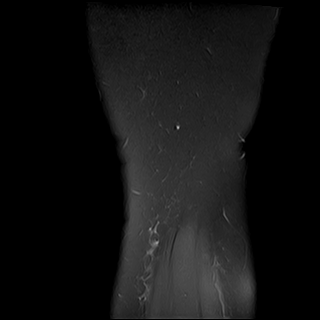
[im 9/30]
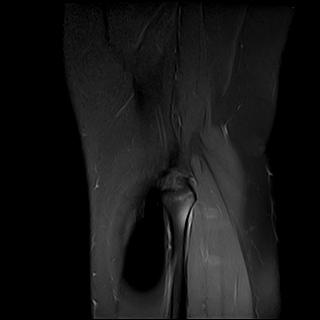
[im 13/30]
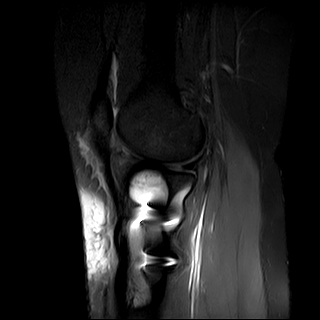
[im 17/30]
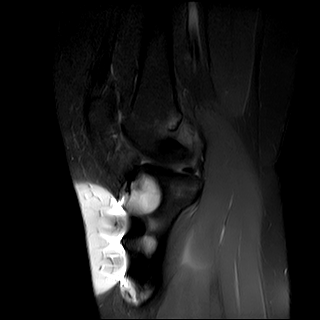
[im 21/30]
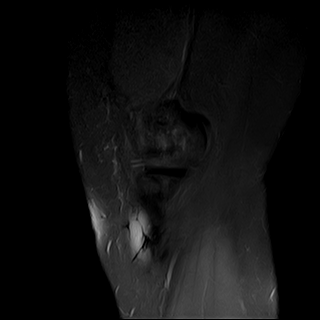
[im 25/30]
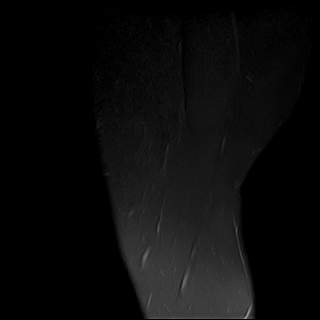
[im 30/30]
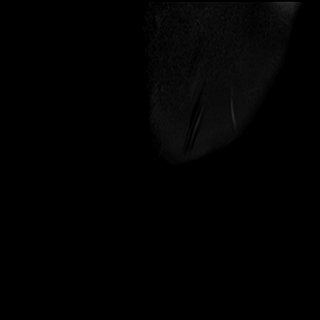

[Series 7: T1 · sagittal · right · 6.0mm · 0.62mm/px · 8 of 28 slices shown]
[im 1/28]
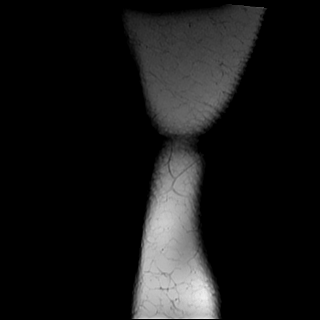
[im 4/28]
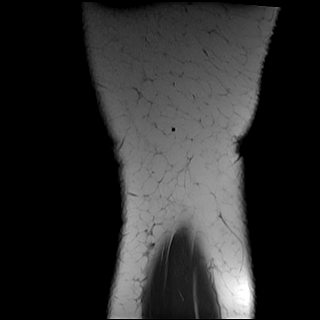
[im 8/28]
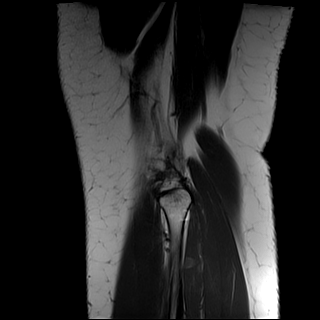
[im 12/28]
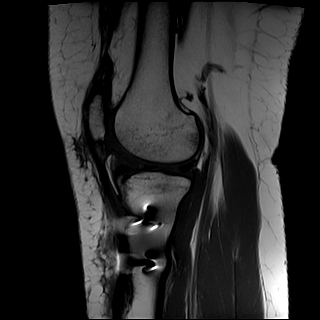
[im 16/28]
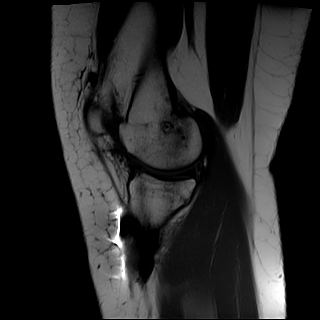
[im 20/28]
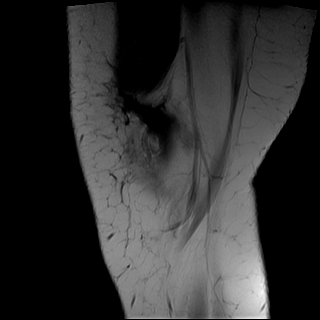
[im 24/28]
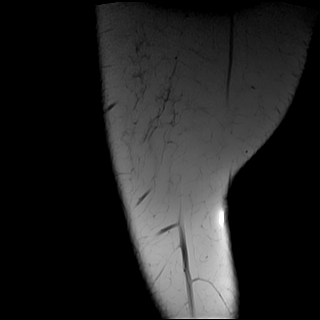
[im 28/28]
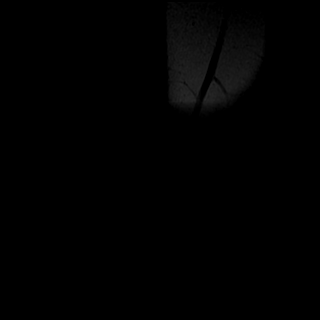

[Series 8: STIR · coronal · right · 5.0mm · 0.73mm/px · 3 of 30 slices shown]
[im 1/30]
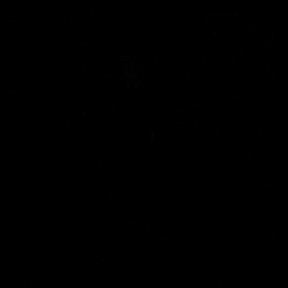
[im 5/30]
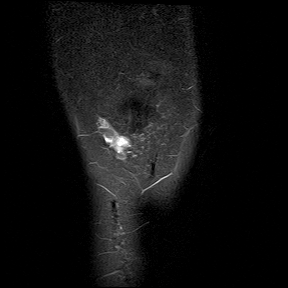
[im 9/30]
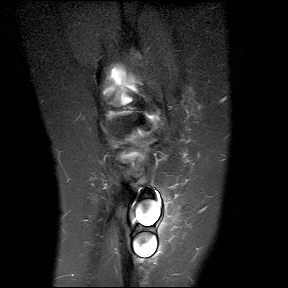

[Series 9: PD fat-sat · coronal · right · 5.0mm · 0.69mm/px · 8 of 30 slices shown (3 of 3)]
[im 1/30]
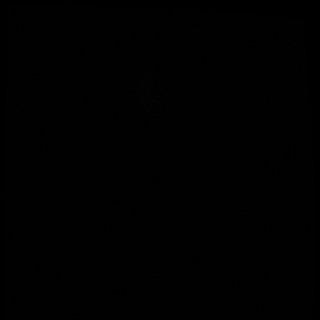
[im 5/30]
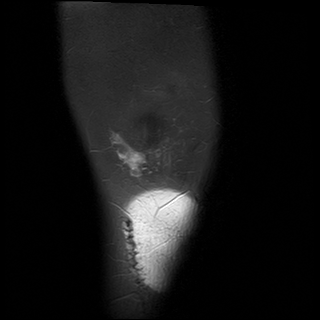
[im 9/30]
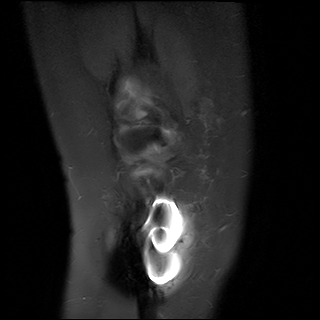
[im 13/30]
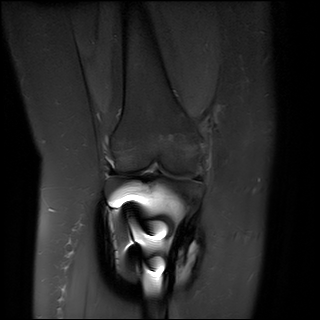
[im 17/30]
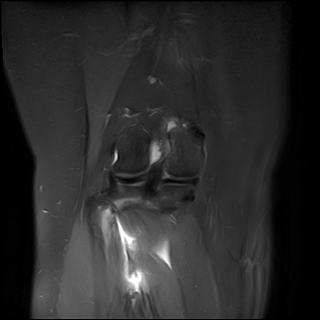
[im 21/30]
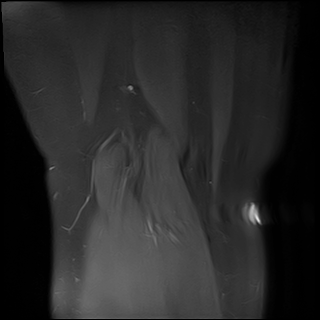
[im 25/30]
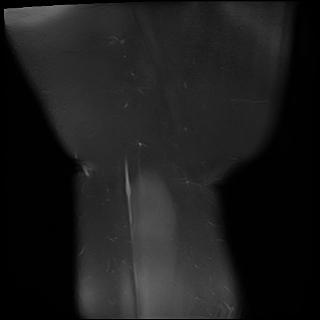
[im 30/30]
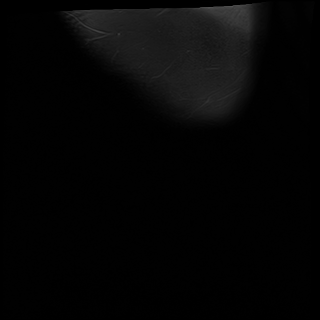

[35 of 40 positions shown; findings below may reference images not displayed]

FINDINGS: Some of the sequences are compromised by artifact from hardware used for prior surgery in the proximal tibia.

No acute bony lesions are seen.  Postsurgical changes of proximal tibia with hardware are noted.

Lateral meniscus shows no acute findings.  Grade 2 degenerative changes of lateral articular cartilage and medial articular cartilage are noted.

Anterior and posterior cruciate ligaments are intact.  Medial meniscus shows no acute findings. Collateral ligaments are intact. 

Quadriceps tendon and patellar tendon are intact.  Grade 3 degenerative changes of patellar articular cartilage are noted.  Soft tissues of the popliteal fossa are unremarkable.
IMPRESSION: 1. No acute bony lesions. Postsurgical changes of proximal tibia with hardware.  Osteophyte formation from the superior anterior aspect of proximal tibia at the knee.

2. Moderate degenerative changes of articular cartilages of the knee joint as described above.

3. No significant soft tissue abnormalities are seen. Infrapatellar bursitis is noted.

Electronically Signed by PIEDRA, DILAND at [id number] [DATE]

## 2023-02-08 IMAGING — MR MRI KNEE LT W/O CONTRAST
5 series · 36 of 40 positions shown · IV contrast (gadolinium)
Comparison: None available.

﻿EXAM:  [id number]   MRI KNEE LT W/O CONTRAST
INDICATION: 22-year-old female sustained injury 3 weeks ago. Pain and swelling.  No history of knee surgery.
TECHNIQUE: Multiplanar, multisequential MRI of the left knee was performed without gadolinium contrast.

[Series 5: PD fat-sat · axial · left · 5.0mm · 0.37mm/px · z∈[-36,+101]mm · 8 of 26 slices shown (1 of 3)]
[im 1/26]
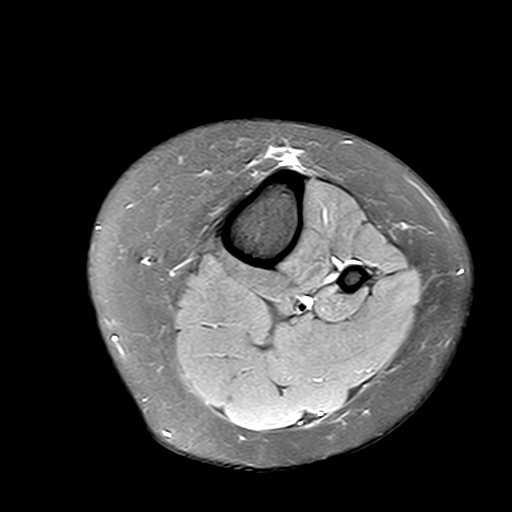
[im 4/26]
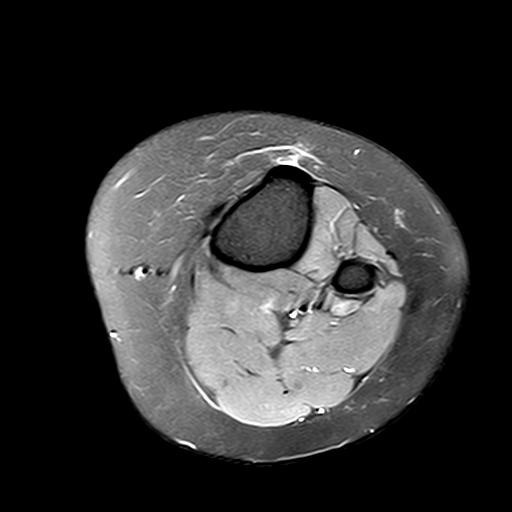
[im 8/26]
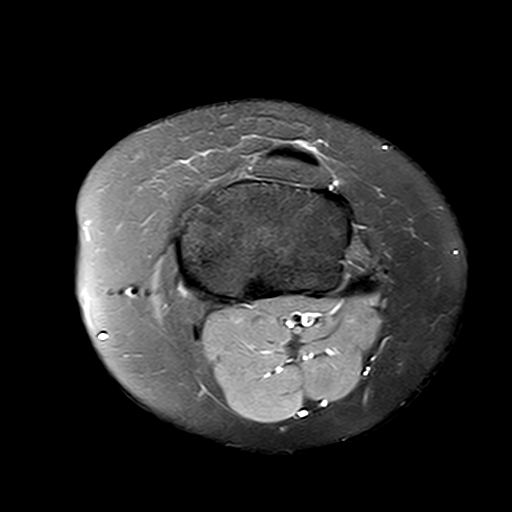
[im 11/26]
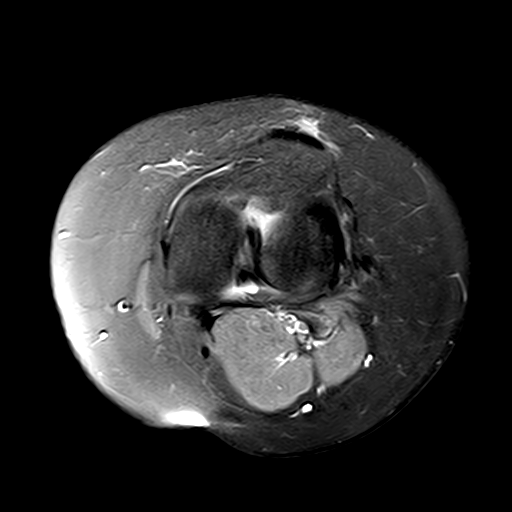
[im 15/26]
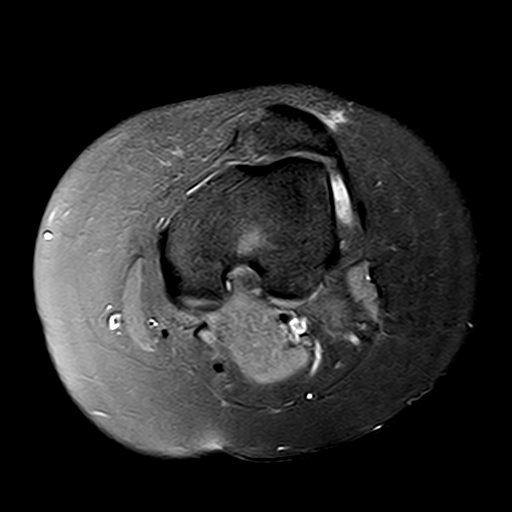
[im 18/26]
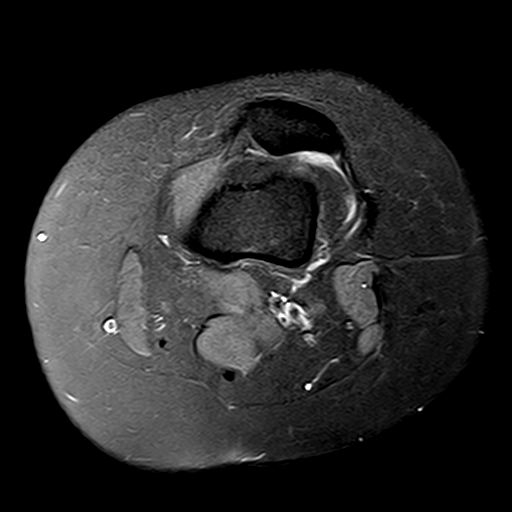
[im 22/26]
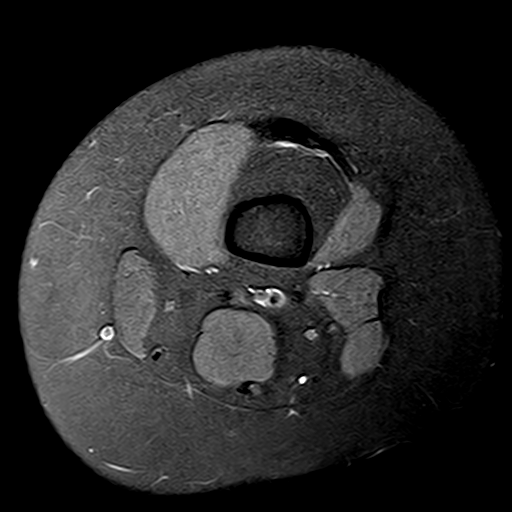
[im 26/26]
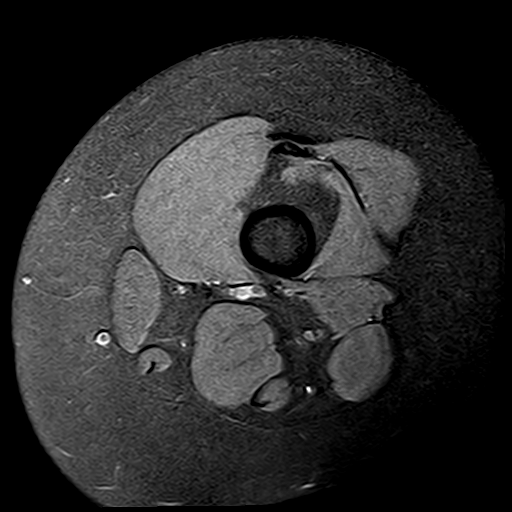

[Series 6: PD fat-sat · sagittal · left · 4.5mm · 0.59mm/px · 7 of 26 slices shown (2 of 3)]
[im 1/26]
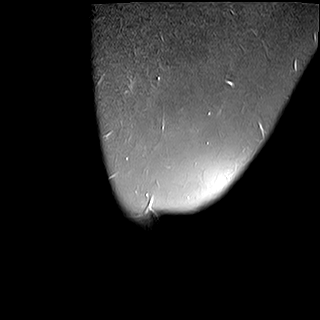
[im 5/26]
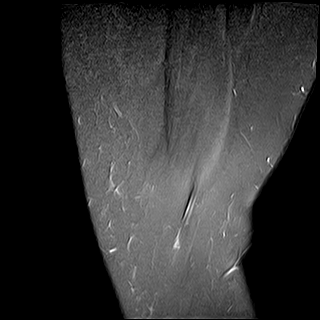
[im 9/26]
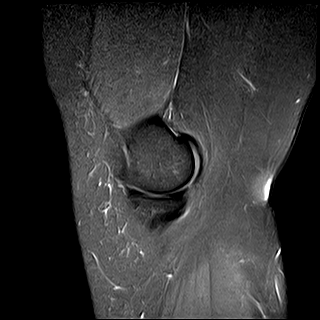
[im 13/26]
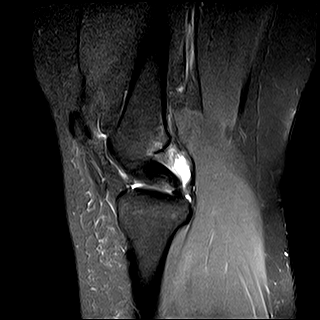
[im 17/26]
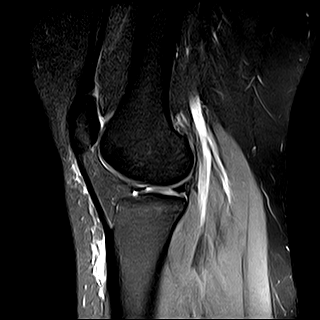
[im 21/26]
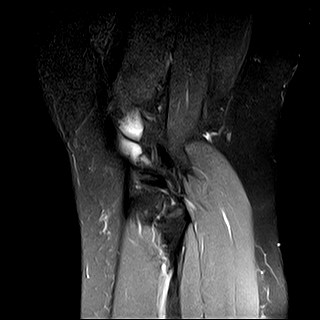
[im 26/26]
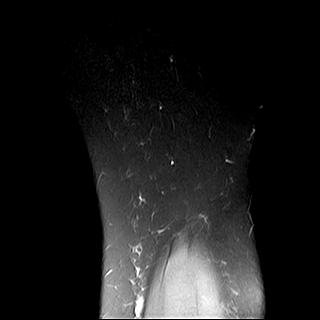

[Series 7: T1 · sagittal · left · 4.5mm · 0.59mm/px · 7 of 26 slices shown]
[im 1/26]
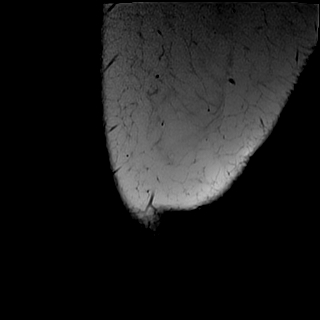
[im 5/26]
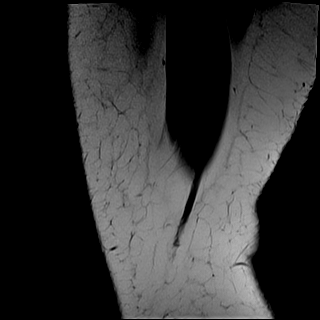
[im 9/26]
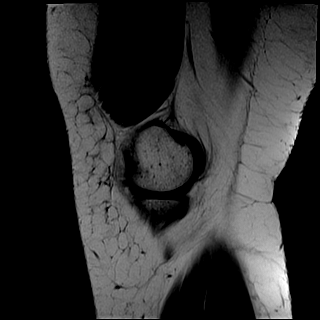
[im 13/26]
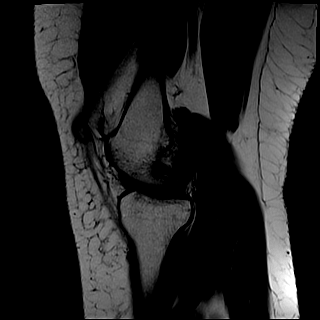
[im 17/26]
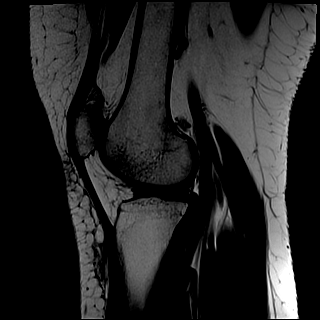
[im 21/26]
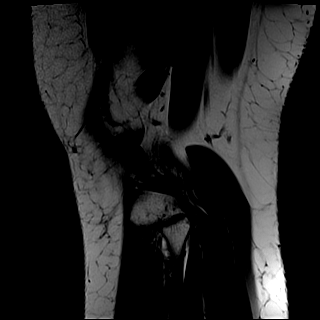
[im 26/26]
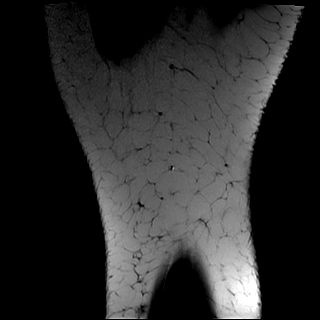

[Series 8: STIR · coronal · left · 4.0mm · 0.66mm/px · 5 of 31 slices shown]
[im 1/31]
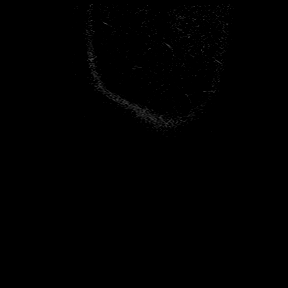
[im 4/31]
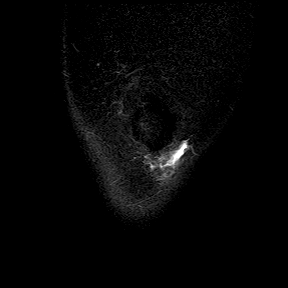
[im 8/31]
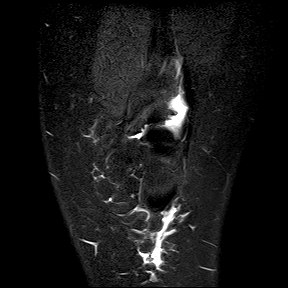
[im 12/31]
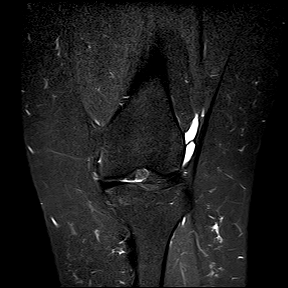
[im 19/31]
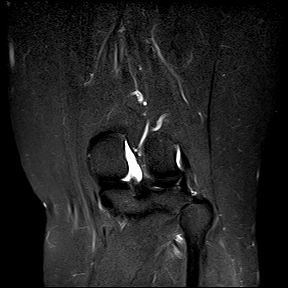

[Series 9: PD fat-sat · coronal · left · 4.0mm · 0.59mm/px · 9 of 31 slices shown (3 of 3)]
[im 1/31]
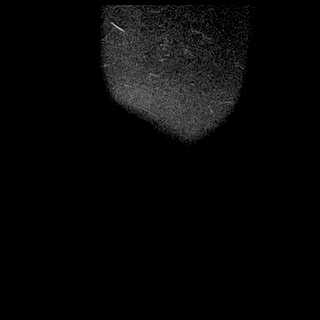
[im 4/31]
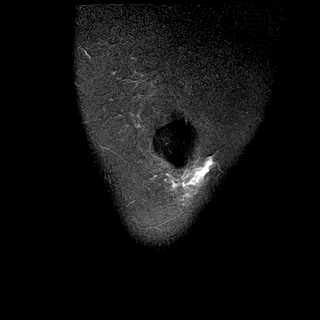
[im 8/31]
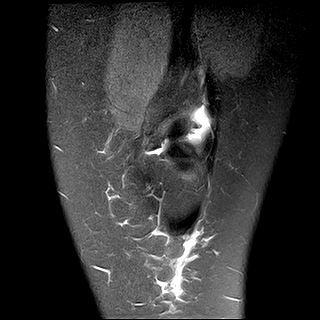
[im 12/31]
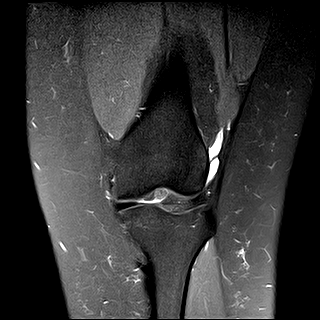
[im 16/31]
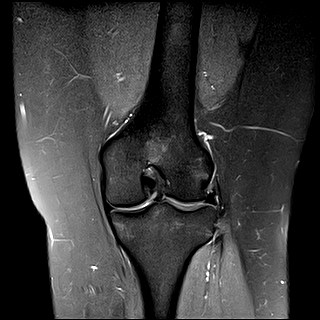
[im 19/31]
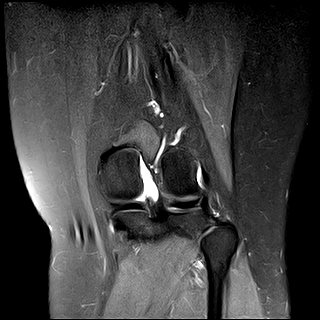
[im 23/31]
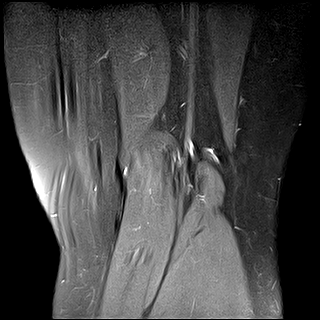
[im 27/31]
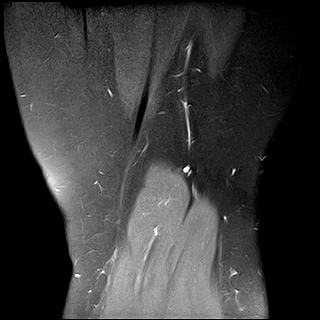
[im 31/31]
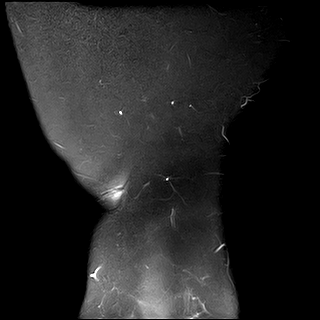

[36 of 40 positions shown; findings below may reference images not displayed]

FINDINGS: Minimal bone marrow edema of the subarticular aspect of medial tibial condyle and intercondylar aspect of distal femur are noted at the knee.  No fracture lines are seen.

Lateral meniscus and lateral articular cartilage are intact. Cruciate ligaments are intact. Medial meniscus shows no acute findings.

Collateral ligaments are intact.

No acute abnormalities of the patella.  Patella alta is noted.  Small effusion in the knee joint.  Infrapatellar bursitis is noted.
IMPRESSION: 1. Minimal bone marrow edema of the subarticular aspect of medial tibial condyle and intercondylar aspect of the distal femur are noted.  No fracture lines are seen. 

2. Patella alta.  Infrapatellar bursitis. Small effusion in the knee joint.

## 2023-03-06 ENCOUNTER — Ambulatory Visit: Admit: 2023-03-06 | Discharge: 2023-03-06 | Payer: MEDICAID | Attending: Specialist | Admitting: Specialist

## 2023-03-06 ENCOUNTER — Ambulatory Visit: Admit: 2023-03-06 | Payer: Medicaid (Managed Care)

## 2023-03-06 VITALS — Ht 59.0 in | Wt 170.0 lb

## 2023-03-06 DIAGNOSIS — M25562 Pain in left knee: Secondary | ICD-10-CM

## 2023-03-06 NOTE — Progress Notes (Signed)
 ASSESSMENT/PLAN     Below is the assessment and plan developed based on review of pertinent history, physical exam, labs, studies, and medications.    1. Left knee pain, unspecified chronicity  -     XR KNEE LEFT (MIN 4 VIEWS); Future  2. Patellar malt

## 2023-03-13 ENCOUNTER — Ambulatory Visit: Admit: 2023-03-13 | Discharge: 2023-03-13 | Payer: Medicaid (Managed Care)

## 2023-03-13 DIAGNOSIS — M228X2 Other disorders of patella, left knee: Principal | ICD-10-CM

## 2023-03-30 ENCOUNTER — Ambulatory Visit: Admit: 2023-03-30 | Discharge: 2023-03-30 | Payer: MEDICAID | Attending: Specialist

## 2023-03-30 NOTE — Progress Notes (Signed)
ASSESSMENT/PLAN     Below is the assessment and plan developed based on review of pertinent history, physical exam, labs, studies, and medications.    1. Left knee pain, unspecified chronicity  2. Patellar maltracking, left        Return if symptoms worsen or fail to improve.     In discussion with the patient, we considered the numerus possible diagnoses that could be contributing to their present symptoms. We also deliberated on the extensive management options that must be considered to treat their current condition. We reviewed their accessible prior medical records, diagnostic tests, and current health and employment information. We considered how these symptoms were affecting the patients activities of daily living as well as employment and fitness activities. The patient had various questions regarding the possible risks, benefits, complications, morbidity and mortality regarding their diagnosis and treatment options. The patients comorbidities were considered, and I advocated that they consider maximizing lifestyle modification through nutrition and exercise to aid in addressing their symptoms. Shared decision making yielded an understanding to move forward with conservation treatment preferences. The patient expressed understanding that if conservative management fails to alleviate the present symptoms they will return to office for re-evaluation and consideration of additional diagnostic tests and potential surgical options.     In the interim, we have recommended ice, elevation, and take prescription anti-inflammatory medications along with a physician directed home exercise program. We discussed the risks and common side effects of anti-inflammatory medications and instructed the patient to discontinue the medication and contact us if they experienced any side effects. The patient was encouraged to discuss the possible side effects with their family physician or pharmacist prior to initiating any  new medications.    We had a long discussion regarding a healthy lifestyle to support musculoskeletal well-being and the importance of preventive maintenance. We touched on individual areas of improvement with diet, exercise, sleep, and social habits. We also reviewed the circumstances surrounding the environment that they live and work which affect a wide range of health risk. We also talked about the fact that the patient's management maybe be significantly limited by social determinants of health. We considered that many patients are limited regarding financial, educational, and health insurance resources along with time constraints to reach their health goals. We considered the limited access to appropriate educational resources regarding proper nutrition and exercise as well as the economic and social support necessary to maintain health and wellbeing.     We considered the more stable conditions of obesity as well as the more progressive condition of left knee patella maltracking and how they contribute to our treatment plan.  Will continue her in physical therapy as well as working with her trainer at school.  We talked about operative and nonoperative treatment.  She is have the right knee Fulkerson osteotomy and is done well.  She will consider timing and give [place] a call back if she decides to proceed with the left knee.    SUBJECTIVE/OBJECTIVE     Joy Hall (DOB: 03/19/2001) is a 23 y.o. female, patient,here for evaluation of the Follow-up (Left knee follow up with MRI results)  .   Of note the patient underwent Right knee arthroscopy with arthroscopic lateral release, abrasion arthroplasty of patellar chondral defect, MACI biopsy and medial capsular plication with open hamstring harvest, medial patellofemoral ligament reconstruction with autologous hamstring and Fulkerson osteotomy on 02/16/2022.  Unfortunately, she has had similar symptoms in the left knee recently.  She reports she has had  multiple  subluxations.  She has been working with a physical therapist with the trainer in [facility].  She reports her knee continues to give out she is worried about falling.  She reports it feels almost exactly like her right knee.    PHYSICAL EXAM     Upon physical examination, the patient is well developed, well nourished, alert and oriented times three, with normal mood and affect and walks with an antalgic gait.    Upon examination of the left knee, the patient is nontender to palpation along the medial and lateral joint lines, and has no effusion. They are tender to palpation along the medial and lateral facets of the patella. They have crepitus of the patellofemoral joint with range of motion and discomfort with patella grind testing. The patient has no discomfort with McMurray's maneuvers, and the knee is stable. They have full range of motion. They have 5/5 strength, and are neurovascularly intact distally. There is no erythema, warmth or skin lesions present.    On examination of the contralateral extremity, the patient is nontender to palpation and has excellent range of motion, stability and strength.    DIAGNOSTIC TEST     I have independently reviewed and interpreted the following test::     EXAM:  MRI KNEE LEFT WO CONTRAST     INDICATION: Anterior left knee pain. Disorder of the patella     COMPARISON: 03/06/2023     TECHNIQUE: Axial T2 fat-saturated and proton density fat-saturated; coronal T1  and proton density fat-saturated; and sagittal T2 fat-saturated, proton density  fat-saturated, and gradient echo MRI of the left knee . Study is limited by body  habitus     CONTRAST:  None.      FINDINGS:      ACL and PCL: Intact.     Collateral ligaments and posterior, lateral corner: Intact.     Extensor mechanism: Intact..      Patellofemoral alignment: Trochlear groove is hypoplastic with moderate lateral  patellar subluxation. TT-TG distance: 17 mm     Joint fluid: Small     Medial meniscus: Body  is slightly truncated     Lateral meniscus: Intact.     Articular cartilage: Minimal fissuring of the inferior lateral patellar facet  cartilage     Bone marrow: Within normal limits. No acute fracture, dislocation, or marrow  replacing process.      Soft tissue mass: None.     IMPRESSION:     1 . Patellar tracking abnormality with minimal fissuring of the inferior lateral  patellar facet cartilage     2. Subtle truncation body medial meniscus likely degenerative    No Known Allergies    Current Outpatient Medications   Medication Sig Dispense Refill    FLUoxetine (PROZAC) 20 MG capsule Take 1 capsule every day by oral route in the morning for 30 days.      hydrOXYzine HCl (ATARAX) 25 MG tablet TAKE 1-2 TABS BY MOUTH AS NEEDED @ BEDTIME FOR INSOMNIA      iron-vitamin C (VITRON-C) 65-125 MG TABS Take 1 tablet by mouth daily      montelukast (SINGULAIR) 10 MG tablet TAKE 1 TABLET BY MOUTH EVERY DAY AT BEDTIME FOR 30 DAYS      cephALEXin (KEFLEX) 500 MG capsule Take 1 capsule by mouth 4 times daily (Patient not taking: Reported on 02/24/2022) 30 capsule 0    ibuprofen (ADVIL;MOTRIN) 600 MG tablet TAKE 1 TABLET 3 TIMES A DAY BY ORAL ROUTE  AS NEEDED. (Patient not taking: Reported on 03/30/2023)       No current facility-administered medications for this visit.       History reviewed. No pertinent past medical history.    History reviewed. No pertinent surgical history.    History reviewed. No pertinent family history.    Social History     Socioeconomic History    Marital status: Single     Spouse name: Not on file    Number of children: Not on file    Years of education: Not on file    Highest education level: Not on file   Occupational History    Not on file   Tobacco Use    Smoking status: Never    Smokeless tobacco: Never   Vaping Use    Vaping status: Never Used   Substance and Sexual Activity    Alcohol use: Never    Drug use: Never    Sexual activity: Not on file   Other Topics Concern    Not on file   Social  History Narrative    Not on file     Social Determinants of Health     Financial Resource Strain: Not on file   Food Insecurity: Not on file   Transportation Needs: Not on file   Physical Activity: Not on file   Stress: Not on file   Social Connections: Not on file   Intimate Partner Violence: Not on file   Housing Stability: Not on file       Review of Systems    Failed to redirect to the Timeline version of the [employer] SmartLink.    Vitals:  Ht 1.499 m (4\' 11" )   Wt 86.2 kg (190 lb)   BMI 38.38 kg/m    Body mass index is 38.38 kg/m.    I was present and agree with the notes added by the fellow to this patients encounter.    An electronic signature was used to authenticate this note.  -- Illene Bolus, MD
